# Patient Record
Sex: Female | Born: 1980 | Race: Black or African American | Hispanic: No | Marital: Single | Smoking: Former smoker
Health system: Southern US, Community
[De-identification: ages and names within clinical notes are randomized; demographics above are authoritative.]

## PROBLEM LIST (undated history)

## (undated) ENCOUNTER — Inpatient Hospital Stay (HOSPITAL_COMMUNITY): Payer: Self-pay

## (undated) DIAGNOSIS — E669 Obesity, unspecified: Secondary | ICD-10-CM

## (undated) DIAGNOSIS — F419 Anxiety disorder, unspecified: Secondary | ICD-10-CM

## (undated) HISTORY — DX: Obesity, unspecified: E66.9

## (undated) HISTORY — PX: NO PAST SURGERIES: SHX2092

## (undated) HISTORY — DX: Anxiety disorder, unspecified: F41.9

---
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
## 2002-03-24 ENCOUNTER — Encounter: Admission: RE | Admit: 2002-03-24 | Discharge: 2002-03-24 | Payer: Self-pay | Admitting: *Deleted

## 2002-05-19 ENCOUNTER — Emergency Department (HOSPITAL_COMMUNITY): Admission: EM | Admit: 2002-05-19 | Discharge: 2002-05-19 | Payer: Self-pay | Admitting: Emergency Medicine

## 2002-05-22 ENCOUNTER — Encounter: Admission: RE | Admit: 2002-05-22 | Discharge: 2002-05-22 | Payer: Self-pay | Admitting: Obstetrics and Gynecology

## 2002-06-12 ENCOUNTER — Encounter: Admission: RE | Admit: 2002-06-12 | Discharge: 2002-06-12 | Payer: Self-pay | Admitting: *Deleted

## 2004-11-11 ENCOUNTER — Emergency Department (HOSPITAL_COMMUNITY): Admission: EM | Admit: 2004-11-11 | Discharge: 2004-11-11 | Payer: Self-pay | Admitting: Emergency Medicine

## 2009-01-25 ENCOUNTER — Ambulatory Visit: Payer: Self-pay | Admitting: Family Medicine

## 2009-01-25 DIAGNOSIS — F329 Major depressive disorder, single episode, unspecified: Secondary | ICD-10-CM

## 2009-01-25 DIAGNOSIS — J45909 Unspecified asthma, uncomplicated: Secondary | ICD-10-CM | POA: Insufficient documentation

## 2009-01-25 DIAGNOSIS — E669 Obesity, unspecified: Secondary | ICD-10-CM

## 2009-02-22 ENCOUNTER — Ambulatory Visit: Payer: Self-pay | Admitting: Family Medicine

## 2009-07-15 ENCOUNTER — Ambulatory Visit: Payer: Self-pay | Admitting: Interventional Radiology

## 2009-07-15 ENCOUNTER — Ambulatory Visit (HOSPITAL_BASED_OUTPATIENT_CLINIC_OR_DEPARTMENT_OTHER): Admission: RE | Admit: 2009-07-15 | Discharge: 2009-07-15 | Payer: Self-pay | Admitting: Internal Medicine

## 2009-07-15 ENCOUNTER — Ambulatory Visit: Payer: Self-pay | Admitting: Family

## 2009-10-04 ENCOUNTER — Ambulatory Visit: Payer: Self-pay | Admitting: Family

## 2009-12-28 ENCOUNTER — Ambulatory Visit: Payer: Self-pay | Admitting: Family

## 2009-12-28 DIAGNOSIS — F341 Dysthymic disorder: Secondary | ICD-10-CM

## 2010-01-03 ENCOUNTER — Telehealth (INDEPENDENT_AMBULATORY_CARE_PROVIDER_SITE_OTHER): Payer: Self-pay | Admitting: *Deleted

## 2010-01-06 ENCOUNTER — Telehealth: Payer: Self-pay | Admitting: Family

## 2010-01-13 ENCOUNTER — Ambulatory Visit: Payer: Self-pay | Admitting: *Deleted

## 2010-01-18 ENCOUNTER — Ambulatory Visit: Payer: Self-pay | Admitting: *Deleted

## 2010-01-24 ENCOUNTER — Telehealth: Payer: Self-pay | Admitting: Family

## 2010-01-25 ENCOUNTER — Ambulatory Visit: Payer: Self-pay | Admitting: *Deleted

## 2010-01-27 ENCOUNTER — Telehealth: Payer: Self-pay | Admitting: Internal Medicine

## 2010-02-02 ENCOUNTER — Ambulatory Visit: Payer: Self-pay | Admitting: Family

## 2010-02-08 ENCOUNTER — Ambulatory Visit: Payer: Self-pay | Admitting: *Deleted

## 2010-04-14 ENCOUNTER — Ambulatory Visit: Payer: Self-pay | Admitting: Family

## 2010-04-14 DIAGNOSIS — R599 Enlarged lymph nodes, unspecified: Secondary | ICD-10-CM | POA: Insufficient documentation

## 2010-04-17 ENCOUNTER — Telehealth: Payer: Self-pay | Admitting: Family

## 2010-06-06 ENCOUNTER — Encounter: Payer: Self-pay | Admitting: Family

## 2010-06-06 ENCOUNTER — Ambulatory Visit: Payer: Self-pay | Admitting: Family

## 2010-06-06 ENCOUNTER — Ambulatory Visit (HOSPITAL_COMMUNITY): Admission: RE | Admit: 2010-06-06 | Discharge: 2010-06-06 | Payer: Self-pay | Admitting: Psychiatry

## 2010-06-06 ENCOUNTER — Emergency Department (HOSPITAL_BASED_OUTPATIENT_CLINIC_OR_DEPARTMENT_OTHER): Admission: EM | Admit: 2010-06-06 | Discharge: 2010-06-06 | Payer: Self-pay | Admitting: Emergency Medicine

## 2010-06-09 ENCOUNTER — Telehealth: Payer: Self-pay | Admitting: Family

## 2010-06-13 ENCOUNTER — Telehealth: Payer: Self-pay | Admitting: Family

## 2010-06-16 ENCOUNTER — Telehealth: Payer: Self-pay | Admitting: Family

## 2010-06-21 ENCOUNTER — Telehealth: Payer: Self-pay | Admitting: Family

## 2010-07-17 ENCOUNTER — Ambulatory Visit: Payer: Self-pay | Admitting: Family

## 2010-07-17 LAB — CONVERTED CEMR LAB: Beta hcg, urine, semiquantitative: NEGATIVE

## 2010-07-21 ENCOUNTER — Ambulatory Visit: Payer: Self-pay | Admitting: Family

## 2010-07-21 ENCOUNTER — Other Ambulatory Visit: Admission: RE | Admit: 2010-07-21 | Discharge: 2010-07-21 | Payer: Self-pay | Admitting: Internal Medicine

## 2010-07-21 DIAGNOSIS — J209 Acute bronchitis, unspecified: Secondary | ICD-10-CM

## 2010-07-21 LAB — CONVERTED CEMR LAB: Pap Smear: NEGATIVE

## 2010-07-21 LAB — HM PAP SMEAR

## 2010-07-26 ENCOUNTER — Telehealth: Payer: Self-pay | Admitting: Family

## 2010-08-16 ENCOUNTER — Encounter: Payer: Self-pay | Admitting: Family

## 2010-08-22 ENCOUNTER — Ambulatory Visit: Payer: Self-pay | Admitting: Family

## 2010-08-22 ENCOUNTER — Telehealth: Payer: Self-pay | Admitting: Family

## 2010-08-29 ENCOUNTER — Ambulatory Visit: Payer: Self-pay | Admitting: Family

## 2010-10-10 NOTE — Progress Notes (Signed)
  Phone Note Outgoing Call   Call placed by: Lemont Fillers FNP,  April 17, 2010 10:01 AM Call placed to: Patient Summary of Call: Called patient to follow up.  She reports that she is feeling better.  Pain/swelling is improved.  Recommended that she keep her scheduled follow up on 8/12- call sooner if problems.  Initial call taken by: Lemont Fillers FNP,  April 17, 2010 10:02 AM

## 2010-10-10 NOTE — Progress Notes (Signed)
  Phone Note Outgoing Call   Call placed by: Lemont Fillers FNP,  June 21, 2010 4:30 PM Call placed to: Patient Summary of Call: Called patient to check on her.  She tells me that she is doing well. She had a family emergency and missed her appointment.  She is back to work today.  Recommended to patient that she arrange a follow up apt in a few weeks.  She agrees to this plan. Initial call taken by: Lemont Fillers FNP,  June 21, 2010 4:31 PM

## 2010-10-10 NOTE — Letter (Signed)
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 [address]   [place], [place] [address]   Phone: [phone]  Fax: [phone]    April 14, 2010   Employee:  ARLISS HEPBURN    To Whom It May Concern:   For Medical reasons, please excuse the above named employee from work for the following dates:  Start:   04/14/10  End:   04/16/10  If you need additional information, please feel free to contact our office.         Sincerely,    Lemont Fillers FNP

## 2010-10-10 NOTE — Progress Notes (Signed)
----   Converted from flag ---- ---- 12/29/2009 3:48 PM, Lemont Fillers FNP wrote: Pls call patient and let her know that her FMLA forms are available for pick up.  (in folder) Could you pls copy these forms to be scanned also.  Thanks ------------------------------  Pt notified forms are ready to be picked up. Copy sent to be scanned.

## 2010-10-10 NOTE — Progress Notes (Signed)
  Phone Note Outgoing Call   Call placed by: Lemont Fillers FNP,  June 09, 2010 2:24 PM Call placed to: Patient Summary of Call: Called patient to see how she was feeling.  She tells me that she is feeling better today.  She did go over to [employer], but felt that the group setting "was not for me." She wishes to follow up with Alain Honey.  Denies current suicide ideation.  She will plan to f/u on 10/9.  Taking citalopram- tolerating it well. Initial call taken by: Lemont Fillers FNP,  June 09, 2010 2:25 PM

## 2010-10-10 NOTE — Progress Notes (Signed)
Summary: ?date of return to work  Phone Note Call from Patient   Caller: Patient Call For: Lemont Fillers FNP Summary of Call: Pt left voice message stating that she thought Melissa told her she could return to work on 10/11. Form says 27th. Pt would like clarification so she can return to work. Please advise. Nicki Guadalajara Fergerson CMA Duncan Dull)  June 16, 2010 11:48 AM   Follow-up for Phone Call        If she is feeling well, I think that she should be able to return on 10/11, however, I would like to see her back before that time for a follow up visit and I will complete her paperwork at that time. Follow-up by: Lemont Fillers FNP,  June 16, 2010 11:53 AM  Additional Follow-up for Phone Call Additional follow up Details #1::        Pt states she is feeling well enough to return to work on the 11th. She has scheduled appt for 10/10 @ 2:15pm. Mervin Kung CMA (AAMA)  June 16, 2010 1:45 PM

## 2010-10-10 NOTE — Assessment & Plan Note (Signed)
Summary: LUMP IN THROAT./MHF--Rm 4   Vital Signs:  Patient profile:   30 year old female Height:      63 inches Weight:      244 pounds BMI:     43.38 Temp:     98.2 degrees F oral Pulse rate:   72 / minute Pulse rhythm:   regular Resp:     18 per minute BP sitting:   128 / 88  (right arm) Cuff size:   large  Vitals Entered By: Mervin Kung CMA Duncan Dull) (July 17, 2010 8:50 AM) CC: Rm 4   Pt states states she has swelling under her chin since Saturday, also has chest soreness.  Still having knee pains and weakness and pains in her fingers. Is Patient Diabetic? No Comments Pt states Ortho Evra patch would not stay on. Does not have replacement at this time. Pt has completed Augmentin. Nicki Guadalajara Fergerson CMA Duncan Dull)  July 17, 2010 8:58 AM    Primary Care Provider:  Lemont Fillers FNP  CC:  Rm 4   Pt states states she has swelling under her chin since Saturday and also has chest soreness.  Still having knee pains and weakness and pains in her fingers..  History of Present Illness: Stacey Chen is a 30 year old female who presents today with complaint of "lump in throat" and for follow up.   1) Depression- Patient is taking Citalopram 20.  Denies SE's.  Overall energy is impr0oved.  "almost back to my old self."  Feels like she is handling stress better, though she is in the middle of a separation.  Some trouble sleepins some nights.  Able to get OOB in the AM.  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    2) Birth Control- stopped patch due to "falling off."    3) "lump in throat"  Symptoms started saturday, + cold symptoms x 4 days.  Took generic cough/cold.  Feels achey especially in her hands and right knee pain.  Knee bothers her the most after work.  Notes that her knee pain became worse after she Denies fever, mild cough resolved.  Mild nasal congesiton- yellow mucous.    Allergies (verified): No Known Drug Allergies  Past History:  Past Medical History: Last  updated: 01/25/2009 Mild intermittent asthma obesity anxiety  gyn Dr Marcelle Overlie  Past Surgical History: Last updated: 01/25/2009 none  Review of Systems       see HPI  Physical Exam  General:  Well-developed,well-nourished,in no acute distress; alert,appropriate and cooperative throughout examination Head:  Normocephalic and atraumatic without obvious abnormalities. No apparent alopecia or balding. Mouth:  Oral mucosa and oropharynx without lesions or exudates.  Teeth in good repair. Neck:  + marble sized tender mass beneath chin without associated erythema or swelling Lungs:  Normal respiratory effort, chest expands symmetrically. Lungs are clear to auscultation, no crackles or wheezes. Heart:  Normal rate and regular rhythm. S1 and S2 normal without gallop, murmur, click, rub or other extra sounds. Psych:  Cognition and judgment appear intact. Alert and cooperative with normal attention span and concentration. No apparent delusions, illusions, hallucinations   Impression & Recommendations:  Problem # 1:  LYMPHADENOPATHY (ICD-785.6) Assessment New Will plan empiric treatment with augmentin.  May be reactive LN due to viral etiology.  Plan close follow up.  Pt instructed to call if symptoms occur as listed on pt sign out sheet.  Her updated medication list for this problem includes:  Augmentin 875-125 Mg Tabs (Amoxicillin-pot clavulanate) ..... One tablet by mouth two times a day x 10 days  Problem # 2:  DEPRESSION (ICD-311) Assessment: Improved Depression is much improved today.  She is tolerating citalopram.  Plan to continue same.  Her updated medication list for this problem includes:    Citalopram Hydrobromide 20 Mg Tabs (Citalopram hydrobromide) ..... One tablet by mouth daily    Klonopin 0.5 Mg Tabs (Clonazepam) ..... One tab by mouth every 8 hours as needed for anxiety  Problem # 3:  CONTRACEPTIVE MANAGEMENT (ICD-V25.09) Assessment: Comment Only Urine pregnancy test  was negative.  Pt to start OCP today.  Advised to use condoms for prevention of STD.  Also advise pt that the augmentin may interfere with the efficacy of the pill- and to use condoms as back up method.  Pt verbalizes understanding Orders: Urine Pregnancy Test  ([address])  Complete Medication List: 1)  Ventolin Hfa 108 (90 Base) Mcg/act Aers (Albuterol sulfate) .... 2 puffs every 6 hours as needed for wheezing 2)  Advair Hfa 115-21 Mcg/act Aero (Fluticasone-salmeterol) .... 2 puffs twice daily 3)  Citalopram Hydrobromide 20 Mg Tabs (Citalopram hydrobromide) .... One tablet by mouth daily 4)  Microgestin Fe 1.5/30 1.5-30 Mg-mcg Tabs (Norethin ace-eth estrad-fe) .... Take as directed. 5)  Klonopin 0.5 Mg Tabs (Clonazepam) .... One tab by mouth every 8 hours as needed for anxiety 6)  Augmentin 875-125 Mg Tabs (Amoxicillin-pot clavulanate) .... One tablet by mouth two times a day x 10 days  Patient Instructions: 1)  Please schedule a follow up appointment on Friday for a Pap smear and follow up of your throat pain. 2)  Start your birth control pill tonight. 3)  Call if fever, chills, increased pain, swelling, redness, of your neck/throat. Prescriptions: CITALOPRAM HYDROBROMIDE 20 MG TABS (CITALOPRAM HYDROBROMIDE) one tablet by mouth daily  #30 x 2   Entered and Authorized by:   Lemont Fillers FNP   Signed by:   Lemont Fillers FNP on 07/17/2010   Method used:   Electronically to        [facility] [phone]* (retail)       [address] [place], [place]  [address]       Ph: [phone]       Fax: [phone]   RxID:   [id number] AUGMENTIN 875-125 MG TABS (AMOXICILLIN-POT CLAVULANATE) one tablet by mouth two times a day x 10 days  #20 x 0   Entered and Authorized by:   Lemont Fillers FNP   Signed by:   Lemont Fillers FNP on 07/17/2010   Method used:   Electronically to        [facility] [phone]* (retail)       [address] [place], [place]  [address]       Ph: [phone]       Fax: [phone]   RxID:   [id number] MICROGESTIN FE 1.5/30 1.5-30 MG-MCG TABS (NORETHIN ACE-ETH ESTRAD-FE) take as directed.  #1 x 11   Entered and Authorized by:   Lemont Fillers FNP   Signed by:   Lemont Fillers FNP on 07/17/2010   Method used:   Electronically to        [facility] [phone]* (retail)  [address] [place], [place]  [address]       Ph: [phone]       Fax: [phone]   RxID:   [id number]    Orders Added: 1)  Urine Pregnancy Test  [[address]] 2)  Est. Patient Level III [[address]]    Current Allergies (reviewed today): No known allergies    Laboratory Results   Urine Tests   Date/Time Reported: Mervin Kung CMA Duncan Dull)  July 17, 2010 9:38 AM     Urine HCG: negative

## 2010-10-10 NOTE — Progress Notes (Signed)
Summary: Irregular Bleeding  Phone Note Call from Patient Call back at Home Phone [phone]   Caller: Patient Summary of Call: patient called and left voice message stating is having problems with her menstrual cycle,and would like a call back.  call was retruned to patient at  [phone]. She states she is on her 4th week of her birth control , and  had been bleeding for 14 days prior to start of her 4th week. She is wearing tampons and using 2-3 daily, she is cramping, and states she  is not really bleeding that heavy. She wanted to know if she should be concerned. She does not have gynecologist that she is following. She states Efraim Kaufmann is following her for her gyn needs. She is aware that Efraim Kaufmann is out of the office today. I informed patient that I would check with Dr Artist Pais to see what he advises and get back to her.  Initial call taken by: Glendell Docker CMA,  Jan 27, 2010 12:15 PM  Follow-up for Phone Call        I suggest f/u with Melissa on Monday as  long as no severe bleeding Follow-up by: D. Thomos Lemons DO,  Jan 27, 2010 1:09 PM  Additional Follow-up for Phone Call Additional follow up Details #1::        attempted to contact patient at [phone], no answer, detailed voice message left informing patient per Dr Artist Pais instructions Additional Follow-up by: Glendell Docker CMA,  Jan 27, 2010 4:38 PM

## 2010-10-10 NOTE — Letter (Signed)
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 [address]   [place], [place] [address]   Phone: [phone]  Fax: [phone]    June 06, 2010   Employee:  Stacey Chen    To Whom It May Concern:   For Medical reasons, please excuse the above named employee from work for the following dates:  Start:   06/06/10  End:   06/20/10  If you need additional information, please feel free to contact our office.         Sincerely,    Lemont Fillers FNP
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
## 2010-10-10 NOTE — Assessment & Plan Note (Signed)
Summary: BREATHING ISSUES/MHF   Vital Signs:  Patient profile:   30 year old female Weight:      260.50 pounds O2 Sat:      99 % on Room air Temp:     98.2 degrees F oral Pulse rate:   95 / minute BP sitting:   130 / 80  Vitals Entered By: Stacey Chen (October 04, 2009 2:14 PM)  O2 Flow:  Room air CC: c/o sob,cough,sob,sorethroat runny nose   Primary Care Provider:  Seymour Bars DO  CC:  c/o sob, cough, sob, and sorethroat runny nose.  History of Present Illness: Stacey Chen presents today with complaint of chest tightness, worse with exertion. Symptoms started 1 week ago.   Notes + wheezing.  Was seen in early November with same symptoms and had a negative CXR and symptoms improved with prednisone.  Notes that her asthma typically flares with change of seasons.  Has had asthma since she was a baby.  Notes that she is using her MDI every 3 hours.    Allergies: No Known Drug Allergies  Review of Systems       Denies fever,  Cough productive of milky colored sputum with brown "specks" Notes + nasal discharge- yellow,  + HA  Physical Exam  General:  Well-developed,well-nourished,in no acute distress; alert,appropriate and cooperative throughout examination Ears:  External ear exam shows no significant lesions or deformities.  Otoscopic examination reveals clear canals, tympanic membranes are intact bilaterally without bulging, retraction, inflammation or discharge. Hearing is grossly normal bilaterally. Mouth:  Oral mucosa and oropharynx without lesions or exudates.  Teeth in good repair. Neck:  No deformities, masses, or tenderness noted. Lungs:  fair air movement, no audible wheezing, no crackles, + cough Heart:  Normal rate and regular rhythm. S1 and S2 normal without gallop, murmur, click, rub or other extra sounds.   Impression & Recommendations:  Problem # 1:  ASTHMA, WITH ACUTE EXACERBATION (ION-629.52) Assessment Deteriorated Wil add maintenance med Advair, will  also treat with cefdinir-  patient given sample of advair lot [id number] exp 05/2010- patient instructed on use and to gargle after each use to avoid thrush The following medications were removed from the medication list:    Prednisone (pak) 10 Mg Tabs (Prednisone) .Marland KitchenMarland KitchenMarland KitchenMarland Kitchen 3 tabs for 2 days, then 2 tabs for 2 days, then 1 tab for 2 days then stop Her updated medication list for this problem includes:    Ventolin Hfa 108 (90 Base) Mcg/act Aers (Albuterol sulfate) .Marland Kitchen... 2 puffs every 6 hours as needed for wheezing    Advair Diskus 100-50 Mcg/dose Aepb (Fluticasone-salmeterol) ..... One puff twice daily    Prednisone 10 Mg Tabs (Prednisone) .Marland Kitchen... Take as directed  Problem # 2:  SINUSITIS (ICD-473.9) Assessment: New Will treat with cefdinir.    The following medications were removed from the medication list:    Zithromax Z-pak 250 Mg Tabs (Azithromycin) .Marland Kitchen... 2 tabs by mouth today, then one tablet by mouth daily for 4 days. Her updated medication list for this problem includes:    Cefdinir 300 Mg Caps (Cefdinir) ..... One tablet by mouth two times a day x 10 days  Complete Medication List: 1)  Ventolin Hfa 108 (90 Base) Mcg/act Aers (Albuterol sulfate) .... 2 puffs every 6 hours as needed for wheezing 2)  Cefdinir 300 Mg Caps (Cefdinir) .... One tablet by mouth two times a day x 10 days 3)  Advair Diskus 100-50 Mcg/dose Aepb (Fluticasone-salmeterol) .... One puff twice daily 4)  Prednisone 10 Mg Tabs (Prednisone) .... Take as directed  Patient Instructions: 1)  Prednisone taper- take 4 tablets daily x 2 days, then 3 tabs daily x 2 days, then 2 tabs daily x 2 days, then 1 tab daily for 2 days then stop. 2)  Call if your symptoms worsen or do not improve.  If severe difficulty breathing go to ER. 3)  Follow up in 10 days. Prescriptions: PREDNISONE 10 MG TABS (PREDNISONE) take as directed  #22 x 0   Entered and Authorized by:   Lemont Fillers FNP   Signed by:   Lemont Fillers FNP on  10/04/2009   Method used:   Electronically to        [facility] [phone]* (retail)       [address] [place], [place]  [address]       Ph: [phone]       Fax: [phone]   RxID:   [id number] ADVAIR DISKUS 100-50 MCG/DOSE AEPB (FLUTICASONE-SALMETEROL) one puff twice daily  #1 x 0   Entered and Authorized by:   Lemont Fillers FNP   Signed by:   Lemont Fillers FNP on 10/04/2009   Method used:   Electronically to        [facility] [phone]* (retail)       [address] [place], [place]  [address]       Ph: [phone]       Fax: [phone]   RxID:   [id number] CEFDINIR 300 MG CAPS (CEFDINIR) one tablet by mouth two times a day x 10 days  #20 x 0   Entered and Authorized by:   Lemont Fillers FNP   Signed by:   Lemont Fillers FNP on 10/04/2009   Method used:   Electronically to        [facility] [phone]* (retail)       [address] [place], [place]  [address]       Ph: [phone]       Fax: [phone]   RxID:   [id number]

## 2010-10-10 NOTE — Assessment & Plan Note (Signed)
Summary: SORE THROAT/MHF--Rm 5   Vital Signs:  Patient profile:   30 year old female Height:      63 inches Weight:      248.50 pounds BMI:     44.18 Temp:     98.5 degrees F oral Pulse rate:   84 / minute Pulse rhythm:   regular Resp:     20 per minute BP sitting:   108 / 68  (right arm) Cuff size:   thigh  Vitals Entered By: Mervin Kung CMA Duncan Dull) (April 14, 2010 9:55 AM) CC: Room 5   Pt states that she has had swelling and pain on the right side of her face, head, neck and throat x 2 days. Is Patient Diabetic? No Comments Pt has stopped Loestrin because she can't remember to take it. Wants to try a different method.  Nicki Guadalajara Fergerson CMA [facility]ull)  April 14, 2010 9:50 AM    Primary Care Provider:  Seymour Bars DO  CC:  Room 5   Pt states that she has had swelling and pain on the right side of her face, head, and neck and throat x 2 days.Marland Kitchen  History of Present Illness: Stacey Chen is a 30 year old female who presents today with complaint of tenderness of the right side of her neck.  Notes that it hurts to swallow, eat, yawn. She also has pain on the right side of her face and head.  She Denies fever,  + nausea, + fatigue, + weakness.  Took a daytime OTC sinus prep without improvement.  These symptoms started 2 days.  Having difficulty sleeping.    Patient reports that she has been having trouble remembering to take her birth control pill.  She has discontinued, but would like to discuss an alternative method. Gained a lot of weight on Depo in the past. Did not like nuvaring- found it uncomfortable.  Allergies (verified): No Known Drug Allergies  Past History:  Past Medical History: Last updated: 01/25/2009 Mild intermittent asthma obesity anxiety  gyn Dr Marcelle Overlie  Past Surgical History: Last updated: 01/25/2009 none  Family History: Last updated: 01/25/2009 Mother: depression, drug abuse, ETOH Father Drug abuse Grandparents: drugs and ETOH 2 younger sisters  healthy  Social History: Last updated: 01/25/2009 Lives at home w/ husband.  No kids. Smokes THC.  Occas ETOH. Works  in [employer]. In wt loss program, exercising regularly.  Risk Factors: Alcohol Use: 1 every 5 months (07/15/2009) Caffeine Use: 1 -2 beverages per month (07/15/2009) Exercise: no (07/15/2009)  Risk Factors: Smoking Status: never (07/15/2009)  Physical Exam  General:  Well-developed, morbidly obese AA female in no acute distress; alert,appropriate and cooperative throughout examination Ears:  R ear normal and L ear normal.   Mouth:  Oral mucosa and oropharynx without lesions or exudates.  Teeth in good repair.no exudates.   Neck:  + tenderess under right side of jaw, no sigificant swelling.   Lungs:  Normal respiratory effort, chest expands symmetrically. Lungs are clear to auscultation, no crackles or wheezes. Heart:  Normal rate and regular rhythm. S1 and S2 normal without gallop, murmur, click, rub or other extra sounds.   Impression & Recommendations:  Problem # 1:  PAROTITIS, RIGHT (ICD-527.2) Assessment New Rapid strep is negative.  Will plan to treat patient with Augmentin.  If symptoms worsen or do not improve, consider CT neck for further evaluation. She was advised that this may interfere the effectiveness of her birth control and she should use back up  protection.  Problem # 2:  CONTRACEPTIVE MANAGEMENT (ICD-V25.09) Assessment: Comment Only Will give trial of OrthoEvra patch.  Complete Medication List: 1)  Ventolin Hfa 108 (90 Base) Mcg/act Aers (Albuterol sulfate) .... 2 puffs every 6 hours as needed for wheezing 2)  Advair Hfa 115-21 Mcg/act Aero (Fluticasone-salmeterol) .... 2 puffs twice daily 3)  Cymbalta 60 Mg Cpep (Duloxetine hcl) .... One tablet by mouth daily 4)  Ortho Evra 150-20 Mcg/24hr Ptwk (Norelgestromin-eth estradiol) .... Info: start on day 1 of menstrual cycle or on 1st "sunday after onset of menstrual cycle apply patch same day each  wk 5)  Klonopin 0.5 Mg Tabs (Clonazepam) .... One tab by mouth every 8 hours as needed for anxiety 6)  Augmentin 875-125 Mg Tabs (Amoxicillin-pot clavulanate) .... One tablet by mouth two times a day x 10 days  Other Orders: Rapid Strep (87880)  Patient Instructions: 1)  Call if you develop fever, increased pain, swelling, or inability to swallow.   2)  Please follow up in 1 week. 3)  Ortho- Evra Patch:  4)  The Patch may be placed on your upper outer arm, abdomen, buttock or back, in a place where it won't be rubbed by tight clothing;, for example, do not place the Patch at waistband of your clothing. 5)  - Do not put the Patch on your breasts, on cut or irritated skin, or on same location as the last Patch 6)  -Before you apply the Patch: 7)  - Make sure your skin is clean and dry 8)  - Do not use lotions, creams, oils, powders, and make-up at the Patch site. It may cause the Patch to not stick properly or become loose 9)  - If a patch results in uncomfortable irritation, the patch may be removed and a new patch may be applied to a new loction until the next change day. Only one patch should be worn at a time Prescriptions: AUGMENTIN 875-125 MG TABS (AMOXICILLIN-POT CLAVULANATE) one tablet by mouth two times a day x 10 days  #20 x 0   Entered and Authorized by:   Melissa S O'Sullivan FNP   Signed by:   Melissa S O'Sullivan FNP on 04/14/2010   Method used:   Electronically to        [facility]  [address] (retail)       [address]       [place]       [place], [place]  [address]       Ph: [phone]       Fax: [phone]   RxID:   [id number] ORTHO EVRA 150-20 MCG/24HR PTWK (NORELGESTROMIN-ETH ESTRADIOL) Info: start on day 1 of menstrual cycle or on 1st Sunday after onset of menstrual cycle apply patch same day each wk  #3 x 5   Entered and Authorized by:   Melissa S O'Sullivan FNP   Signed by:   Melissa S O'Sullivan FNP on 04/14/2010   Method used:    Electronically to        [facility]  [address] (retail)       [address] [place], [place]  [address]       Ph: [phone]       Fax: [phone]   RxID:   [id number]   Current Allergies (reviewed today): No known allergies   Laboratory Results  Date/Time Received: 04/14/10 Date/Time Reported: Mervin Kung CMA (  AAMA)  April 14, 2010 10:04 AM   Other Tests  Rapid Strep: negative  Kit Test Internal QC: Positive   (Normal Range: Negative)

## 2010-10-10 NOTE — Assessment & Plan Note (Signed)
Summary: depression/dt rsch per pt/dt--Rm 4   Vital Signs:  Patient profile:   30 year old female Height:      63 inches Weight:      244.50 pounds BMI:     43.47 Temp:     98.3 degrees F oral Pulse rate:   72 / minute Pulse rhythm:   regular Resp:     18 per minute BP sitting:   124 / 84  (right arm) Cuff size:   large  Vitals Entered By: Mervin Kung CMA [facility] D[facility]) (June 06, 2010 8:32 AM) CC: Rm 4    Pt states she has had 3 panic attacks at work in the last week.  Stress is interfering with her work. Wants to discuss medical leave., Depression Is Patient Diabetic? No Pain Assessment Patient in pain? no      Comments Pt is out of Cymbalta, Ortho Evra. Pt having difficulty affording medications due to financial issues at present.  Pt has completed Augmentin. Nicki Guadalajara Fergerson CMA [facility] Dull)  June 06, 2010 8:40 AM     Primary Care Provider:  Seymour Bars DO  CC:  Rm 4    Pt states she has had 3 panic attacks at work in the last week.  Stress is interfering with her work. Wants to discuss medical leave. and Depression.  History of Present Illness: Ms Ranes is a 30 year old female who presents for f/u of her anxiety/depression.  Ran out of cymbalta 3 weeks ago.  She has been having panic attacks on a daily basis.  She reports that she iIs under disciplinary action at workdue to absences. Feels tearful.  Reports daily thoughts of ending her life.  "It takes everything in me not to drive my car into a tree."    Depression History:      Positive alarm features for depression include insomnia.  However, she denies significant weight loss, significant weight gain, and fatigue (loss of energy).        Psychosocial stress factors include a recent marital separation.  Suicide risk questions reveal that she does not feel like life is worth living, she wishes that she were dead, and she has thought about ending her life.        Comments:  Grandmother has cancer, Dad overdosed- still  living. Sisters are homeless.  Having recurrent thoughts about "driving into a tree and ending it all."  .   Allergies (verified): No Known Drug Allergies  Past History:  Past Medical History: Last updated: 01/25/2009 Mild intermittent asthma obesity anxiety  gyn Dr Marcelle Overlie  Past Surgical History: Last updated: 01/25/2009 none  Family History: Reviewed history from 01/25/2009 and no changes required. Mother: depression, drug abuse, ETOH Father Drug abuse Grandparents: drugs and ETOH 2 younger sisters healthy  Social History: Reviewed history from 01/25/2009 and no changes required. Recently separated from husband.  No kids. Smokes THC.  Occas ETOH. Works at [employer] In wt loss program, exercising regularly.  Review of Systems       see HPI  Physical Exam  General:  Well-developed,well-nourished,in no acute distress; alert,appropriate and cooperative throughout examination Psych:  Oriented X3, memory intact for recent and remote, and normally interactive.  Tearful at times    Impression & Recommendations:  Problem # 1:  DEPRESSION/ANXIETY (ICD-300.4) Assessment Deteriorated  Patient has run out of her cymbalta x 3 weeks.  She is having active suicidal thoughts.  Rx provided for citalopram and klonopin.  Cymbalta co-pay is too high for her.  Patient will be given a trial of citalopram which will hopefully be more affordable for her.  Called charge nurse and gave report.  Pt was walked down to ED by CMA.  30 minutes spent with patient.  Greater than 50%of this time was spent counseling patient on her depression/anxiety.  Will fill FMLA paperwork for patient.   Orders: Form Completion (75643)  Complete Medication List: 1)  Ventolin Hfa 108 (90 Base) Mcg/act Aers (Albuterol sulfate) .... 2 puffs every 6 hours as needed for wheezing 2)  Advair Hfa 115-21 Mcg/act Aero (Fluticasone-salmeterol) .... 2 puffs twice daily 3)  Citalopram Hydrobromide 20 Mg Tabs (Citalopram  hydrobromide) .... 1/2 tablet by mouth once daily for one week, then increase to 1 tablet once daily 4)  Ortho Evra 150-20 Mcg/24hr Ptwk (Norelgestromin-eth estradiol) .... Info: start on day 1 of menstrual cycle or on 1st sunday after onset of menstrual cycle apply patch same day each wk 5)  Klonopin 0.5 Mg Tabs (Clonazepam) .... One tab by mouth every 8 hours as needed for anxiety 6)  Augmentin 875-125 Mg Tabs (Amoxicillin-pot clavulanate) .... One tablet by mouth two times a day x 10 days   Patient Instructions: 1)  Please go directly downstairs for further evaluation in the ED.   Prescriptions: KLONOPIN 0.5 MG TABS (CLONAZEPAM) one tab by mouth every 8 hours as needed for anxiety  #90 x 0   Entered and Authorized by:   Lemont Fillers FNP   Signed by:   Lemont Fillers FNP on 06/06/2010   Method used:   Print then Give to Patient   RxID:   [id number] CITALOPRAM HYDROBROMIDE 20 MG TABS (CITALOPRAM HYDROBROMIDE) 1/2 tablet by mouth once daily for one week, then increase to 1 tablet once daily  #30 x 1   Entered and Authorized by:   Lemont Fillers FNP   Signed by:   Lemont Fillers FNP on 06/06/2010   Method used:   Print then Give to Patient   RxID:   [id number]   Current Allergies (reviewed today): No known allergies

## 2010-10-10 NOTE — Assessment & Plan Note (Signed)
Summary: PAP/MHF   Vital Signs:  Patient profile:   30 year old female Height:      63 inches Weight:      245 pounds BMI:     43.56 O2 Sat:      99 % on Room air Temp:     98.0 degrees F oral Pulse rate:   65 / minute Resp:     18 per minute BP sitting:   102 / 70  (right arm) Cuff size:   large  Vitals Entered By: Glendell Docker CMA (July 21, 2010 10:11 AM)  O2 Flow:  Room air CC: pap & follow up Is Patient Diabetic? No Pain Assessment Patient in pain? no      Comments pap, lump gown down, productive cough  brown/yellow in color with breathing difficulty 5 days remaining on antibiotics   Primary Care Provider:  Lemont Fillers FNP  CC:  pap & follow up.  History of Present Illness: Stacey Chen is a 30 year old female who presents today for follow up.  Notes that the swelling beneath her chin is improved.  + Wheezing, brown/yellow sputum.  She continues the Augmentin. Denies fever, ear pain, + sore throat.   She is also  here to have her Pap smear performed.    Preventive Screening-Counseling & Management  Alcohol-Tobacco     Smoking Status: never  Allergies (verified): No Known Drug Allergies  Past History:  Past Medical History: Last updated: 01/25/2009 Mild intermittent asthma obesity anxiety  gyn Dr Marcelle Overlie  Past Surgical History: Last updated: 01/25/2009 none  Review of Systems       see HPI  Physical Exam  General:  Well-developed,well-nourished,in no acute distress; alert,appropriate and cooperative throughout examination Neck:  + firm nodule beneath chin, tender, smaller than last visit. Lungs:  Normal respiratory effort, chest expands symmetrically. Soft expiratory wheeze.  Good air movement to the bases. Heart:  Normal rate and regular rhythm. S1 and S2 normal without gallop, murmur, click, rub or other extra sounds. Abdomen:  Bowel sounds positive,abdomen soft and non-tender without masses, organomegaly or hernias  noted. Genitalia:  Pelvic Exam:        External: normal female genitalia without lesions or masses        Vagina: normal without lesions or masses, thick vaginal discharge        Cervix: normal without lesions or masses        Adnexa: normal bimanual exam without masses or fullness        Uterus: normal by palpation        Pap smear: performed   Impression & Recommendations:  Problem # 1:  ACUTE BRONCHITIS (ICD-466.0) Assessment New Will step up her Advair temporarily to 500/50 (sample given) with plan to drop back down to her current dose 115 of advair.  Add Cheratussin, continue augmentin. Her updated medication list for this problem includes:    Ventolin Hfa 108 (90 Base) Mcg/act Aers (Albuterol sulfate) .Marland Kitchen... 2 puffs every 6 hours as needed for wheezing    Advair Hfa 115-21 Mcg/act Aero (Fluticasone-salmeterol) .Marland Kitchen... 2 puffs twice daily    Augmentin 875-125 Mg Tabs (Amoxicillin-pot clavulanate) ..... One tablet by mouth two times a day x 10 days    Cheratussin Ac 100-10 Mg/16ml Syrp (Guaifenesin-codeine) .Marland Kitchen... 1-2 teaspoons every 6 hours as needed for cough    Advair Diskus 500-50 Mcg/dose Aepb (Fluticasone-salmeterol) ..... One puff twice daily for 1 week, then return to lower dose advair as before  Problem # 2:  ROUTINE GYNECOLOGICAL EXAMINATION (ICD-V72.31) Assessment: Comment Only Pt is requesting, gc/chlamydia and HIV screening as well.    Complete Medication List: 1)  Ventolin Hfa 108 (90 Base) Mcg/act Aers (Albuterol sulfate) .... 2 puffs every 6 hours as needed for wheezing 2)  Advair Hfa 115-21 Mcg/act Aero (Fluticasone-salmeterol) .... 2 puffs twice daily 3)  Citalopram Hydrobromide 20 Mg Tabs (Citalopram hydrobromide) .... One tablet by mouth daily 4)  Microgestin Fe 1.5/30 1.5-30 Mg-mcg Tabs (Norethin ace-eth estrad-fe) .... Take as directed. 5)  Klonopin 0.5 Mg Tabs (Clonazepam) .... One tab by mouth every 8 hours as needed for anxiety 6)  Augmentin 875-125 Mg Tabs  (Amoxicillin-pot clavulanate) .... One tablet by mouth two times a day x 10 days 7)  Cheratussin Ac 100-10 Mg/70ml Syrp (Guaifenesin-codeine) .Marland Kitchen.. 1-2 teaspoons every 6 hours as needed for cough 8)  Advair Diskus 500-50 Mcg/dose Aepb (Fluticasone-salmeterol) .... One puff twice daily for 1 week, then return to lower dose advair as before 9)  Fluconazole 150 Mg Tabs (Fluconazole) .... One tablet by mouth today, repeat in 3 days  Other Orders: T-HIV-1 (Screen) (16109) Pap Smear, Thin Prep ( Collection of) (U0454)  Patient Instructions: 1)  Call if your cough worsens or does not improve. 2)  Complete antibiotics. 3)  Go to lab on first floor this AM please. 4)  Follow up in 1 month.   Prescriptions: FLUCONAZOLE 150 MG TABS (FLUCONAZOLE) one tablet by mouth today, repeat in 3 days  #2 x 0   Entered and Authorized by:   Lemont Fillers FNP   Signed by:   Lemont Fillers FNP on 07/21/2010   Method used:   Samples Given   RxID:   [id number] ADVAIR DISKUS 500-50 MCG/DOSE AEPB (FLUTICASONE-SALMETEROL) one puff twice daily for 1 week, then return to lower dose advair as before  #1 x 0   Entered and Authorized by:   Lemont Fillers FNP   Signed by:   Lemont Fillers FNP on 07/21/2010   Method used:   Samples Given   RxID:   [id number] CHERATUSSIN AC 100-10 MG/5ML SYRP (GUAIFENESIN-CODEINE) 1-2 teaspoons every 6 hours as needed for cough  #120 x 0   Entered and Authorized by:   Lemont Fillers FNP   Signed by:   Lemont Fillers FNP on 07/21/2010   Method used:   Print then Give to Patient   RxID:   [id number]    Orders Added: 1)  T-HIV-1 (Screen) [44034] 2)  Pap Smear, Thin Prep ( Collection of) [Q0091] 3)  Est. Patient Level III [74259]   Immunization History:  Influenza Immunization History:    Influenza:  delcined (07/21/2010)  Tetanus/Td Immunization History:    Tetanus/Td:  historical (08/26/2007)   Contraindications/Deferment of  Procedures/Staging:    Test/Procedure: FLU VAX    Reason for deferment: patient declined   Immunization History:  Influenza Immunization History:    Influenza:  Delcined (07/21/2010)  Tetanus/Td Immunization History:    Tetanus/Td:  Historical (08/26/2007)    Current Allergies (reviewed today): No known allergies   Prevention & Chronic Care Immunizations   Influenza vaccine: Delcined  (07/21/2010)   Influenza vaccine deferral: patient declined  (07/21/2010)    Tetanus booster: 08/26/2007: Historical    Pneumococcal vaccine: Not documented  Other Screening   Pap smear: Not documented   Smoking status: never  (07/21/2010)

## 2010-10-10 NOTE — Progress Notes (Signed)
Summary: status update  Phone Note Outgoing Call   Summary of Call: Could you please call patient to see how her depression is doing on her new medicine?  Thanks Initial call taken by: Lemont Fillers FNP,  January 06, 2010 1:18 PM  Follow-up for Phone Call        Left message on machine to return call.  Mervin Kung CMA  January 06, 2010 2:33 PM   Left message on machine to return my call.  Mervin Kung CMA  Jan 09, 2010 11:05 AM   Additional Follow-up for Phone Call Additional follow up Details #1::        Pt returned my call. States she feels more calm but is still having some difficutly with her anxiety.  She states she has just started the twice a day dose and is aware it may take some time to feel the full affects of the medication.  Mervin Kung CMA  Jan 09, 2010 11:35 AM   Additional Follow-up by: Lemont Fillers FNP,  Jan 09, 2010 11:48 AM

## 2010-10-10 NOTE — Progress Notes (Signed)
  Phone Note Outgoing Call   Call placed by: Lemont Fillers FNP,  Jan 24, 2010 11:06 AM Call placed to: Patient Summary of Call: Reviewed Samara Deist Troxler's consultation re: patient's diagnosis of Bipolar disorder, and referral to Dr. Tacey Heap from psychiatry due to high degree of anxiety and depression, suicidal ideation, confusion about anger mood swings, poor decision-making and rcent social withdrawal.  Left message for patient to return my call.  Would like to see if she has seen Dr. Izola Price and if she is currently having active suicide ideation when she calls back.  She should keep her apt next week. Initial call taken by: Lemont Fillers FNP,  Jan 24, 2010 11:09 AM  Follow-up for Phone Call        Called patient to follow up.  She states overall she is feeling a little better than she was.  She tells me that she did not see Dr. Izola Price since he does not accept her insurance.  Denies current active suicide ideation.  Pt agrees to go to the ER if she develops recurrent suicide ideation.  Will try referral to another psychiatrist.  Pt instructed to keep upcoming appointment. Follow-up by: Lemont Fillers FNP,  Jan 25, 2010 2:58 PM

## 2010-10-10 NOTE — Progress Notes (Signed)
  Phone Note Outgoing Call   Call placed by: Lemont Fillers FNP,  July 26, 2010 4:40 PM Call placed to: Patient Summary of Call: Called patient, reviewed pap results.  Reviewed results + for trichomonas.  Recommended that her partner also be treated.  Rx sent to pharmacy for metronidazole.  Pt tells me rx for fluconazole did not make it to the pharmacy- will not resend at this time due to new finding of trich.  Will treat this.  Pt to call if symptoms worsen or do not improve.  Initial call taken by: Lemont Fillers FNP,  July 26, 2010 4:42 PM    New/Updated Medications: METRONIDAZOLE 500 MG TABS (METRONIDAZOLE) 4 tablet by mouth at once. Prescriptions: VENTOLIN HFA 108 (90 BASE) MCG/ACT AERS (ALBUTEROL SULFATE) 2 puffs every 6 hours as needed for wheezing  #1 x 1   Entered and Authorized by:   Lemont Fillers FNP   Signed by:   Lemont Fillers FNP on 07/26/2010   Method used:   Electronically to        [facility] [phone]* (retail)       [address] [place], [place]  [address]       Ph: [phone]       Fax: [phone]   RxID:   [id number] METRONIDAZOLE 500 MG TABS (METRONIDAZOLE) 4 tablet by mouth at once.  #4 x 0   Entered and Authorized by:   Lemont Fillers FNP   Signed by:   Lemont Fillers FNP on 07/26/2010   Method used:   Electronically to        [facility] [phone]* (retail)       [address] [place], [place]  [address]       Ph: [phone]       Fax: [phone]   RxID:   [id number]

## 2010-10-10 NOTE — Progress Notes (Signed)
Summary: FMLA / [employer] forms  Phone Note Outgoing Call   Summary of Call: FMLA and [employer] income protection forms completed. FMLA forms faxed to Barbaraann Faster [phone] on 06-09-10. [employer] form faxed to [phone] on 06-08-10 @ 1:30pm.  Mervin Kung CMA [facility] Dull)  June 13, 2010 9:50 AM

## 2010-10-10 NOTE — Letter (Signed)
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 [address]   [place], [place] [address]   Phone: [phone]  Fax: [phone]    October 04, 2009   Employee:  Stacey Chen    To Whom It May Concern:   For Medical reasons, please excuse the above named employee from work for the following dates:  Start:   1/24  End:   1/27  If you need additional information, please feel free to contact our office.         Sincerely,    Lemont Fillers FNP

## 2010-10-10 NOTE — Assessment & Plan Note (Signed)
Summary: weak, dizzy, nausea/dt   Vital Signs:  Patient profile:   30 year old female Height:      63 inches Weight:      256 pounds BMI:     45.51 O2 Sat:      99 % on Room air Temp:     98.7 degrees F oral Pulse rate:   102 / minute Pulse rhythm:   regular Resp:     16 per minute BP sitting:   110 / 68  (right arm) Cuff size:   thigh  Vitals Entered By: Mervin Kung CMA (December 28, 2009 1:35 PM)  O2 Flow:  Room air CC: room 5  Pt states she has had dizziness episodes intermittently x 2 weeks but seems to be increasing.  Would like to discuss restarting medication for mood stabilization.  Pt. would also like to restart birth control. Is Patient Diabetic? No   Primary Care Provider:  Seymour Bars DO  CC:  room 5  Pt states she has had dizziness episodes intermittently x 2 weeks but seems to be increasing.  Would like to discuss restarting medication for mood stabilization.  Pt. would also like to restart birth control.Marland Kitchen  History of Present Illness: Stacey Chen is a 30 year old female who presents today for follow up.  She notes that she has had some shortness of breath and dizzy spells which are accompanied by SOB and anxiety.  This interferes with her ability to work at [employer]. Last visit she was started on Advair for acute asthma exacerbation.  She felt that the discus "powder"  caused nausea, didn't like consisitency of powder.    Depression- she has been treated in the past.  She stopped medication due to feeling lack of emotion.  Pt was unsure of the med, but [facility] pharmacy reported that this medication was alprazolam.  Notes that she has felt overwhelmed with work, had increased irritabilty, panic attacks, dizzy spells.  Has "temper tantrums."  Cries daily.  She has seen therapist in the past but felt that she and the therapist were not a good fit.  Denies delusions.  Denies suicide ideation.  She also presents with FMLA papers.    Allergies (verified): No Known Drug  Allergies  Physical Exam  General:  Well-developed,well-nourished,in no acute distress; alert,appropriate and cooperative throughout examination Head:  Normocephalic and atraumatic without obvious abnormalities. No apparent alopecia or balding. Lungs:  Normal respiratory effort, chest expands symmetrically. Lungs are clear to auscultation, no crackles or wheezes. Heart:  Normal rate and regular rhythm. S1 and S2 normal without gallop, murmur, click, rub or other extra sounds.   Impression & Recommendations:  Problem # 1:  ASTHMA, WITH ACUTE EXACERBATION (ICD-493.92) Assessment Improved Pt is agreeable to try Advair HFA in place of Discus.  Sample given.   The following medications were removed from the medication list:    Advair Diskus 100-50 Mcg/dose Aepb (Fluticasone-salmeterol) ..... One puff twice daily    Prednisone 10 Mg Tabs (Prednisone) .Marland Kitchen... Take as directed Her updated medication list for this problem includes:    Ventolin Hfa 108 (90 Base) Mcg/act Aers (Albuterol sulfate) .Marland Kitchen... 2 puffs every 6 hours as needed for wheezing    Advair Hfa 115-21 Mcg/act Aero (Fluticasone-salmeterol) .Marland Kitchen... 2 puffs twice daily  Problem # 2:  DEPRESSION/ANXIETY (ICD-300.4) Assessment: Deteriorated Will give patient trial of Cymbalta and as needed klonopin.  Pt wishes referral to different therapist.  Pt was advise re: red flags and side effect per  pt instructions. 30 minutes spent with patient.  Greater than 50% of this time was spent counselling patient on her depression and anxiety. Orders: Psychology Referral (Psychology)  Problem # 3:  CONTRACEPTIVE MANAGEMENT (ICD-V25.09)  Complete Medication List: 1)  Ventolin Hfa 108 (90 Base) Mcg/act Aers (Albuterol sulfate) .... 2 puffs every 6 hours as needed for wheezing 2)  Advair Hfa 115-21 Mcg/act Aero (Fluticasone-salmeterol) .... 2 puffs twice daily 3)  Cymbalta 30 Mg Cpep (Duloxetine hcl) .... One tablet by mouth daily x 1 week, then increase to  two tabs by mouth daily as tolerated 4)  Loestrin Fe 1.5/30 1.5-30 Mg-mcg Tabs (Norethin ace-eth estrad-fe) .... Take as directed 5)  Klonopin 0.5 Mg Tabs (Clonazepam) .... One tab by mouth every 8 hours as needed for anxiety  Patient Instructions: 1)  Please follow up in 1 month for a PAP/physical and follow up on Depression (30 minute apt) 2)  Cymbalta- It will likely take several weeks before you will notice improvement. 3)  Side effects of this medicine may include drowsiness or nausea.  If this becomes an issue for you call for further instructions. 4)  Very rarely people may develop suicidal thoughts when taking these types of medicines- should this happen to you, discontinue medication and go directly to the emergency room. 5)  Please arrange a follow up appointment in 1 month  Prescriptions: KLONOPIN 0.5 MG TABS (CLONAZEPAM) one tab by mouth every 8 hours as needed for anxiety  #30 x 0   Entered and Authorized by:   Lemont Fillers FNP   Signed by:   Lemont Fillers FNP on 12/28/2009   Method used:   Print then Give to Patient   RxID:   [id number] LOESTRIN FE 1.5/30 1.5-30 MG-MCG TABS (NORETHIN ACE-ETH ESTRAD-FE) take as directed  #1 x 5   Entered and Authorized by:   Lemont Fillers FNP   Signed by:   Lemont Fillers FNP on 12/28/2009   Method used:   Electronically to        [id number]* (retail)       [address] [place], [place]  [address]       Ph: [phone]       Fax: [phone]   RxID:   [id number] CYMBALTA 30 MG CPEP (DULOXETINE HCL) one tablet by mouth daily x 1 week, then increase to two tabs by mouth daily as tolerated  #60 x 1   Entered and Authorized by:   Lemont Fillers FNP   Signed by:   Lemont Fillers FNP on 12/28/2009   Method used:   Electronically to        [facility] [phone]* (retail)       [address] [place], [place]   [address]       Ph: [phone]       Fax: [phone]   RxID:   [id number] ADVAIR HFA 115-21 MCG/ACT AERO (FLUTICASONE-SALMETEROL) 2 puffs twice daily  #1 x 2   Entered and Authorized by:   Lemont Fillers FNP   Signed by:   Lemont Fillers FNP on 12/28/2009   Method used:   Electronically to        [id number]* (retail)       [address]       [place], [place]  [address]       Ph: [phone]       Fax: [phone]   RxID:   [id number]   Current Allergies (reviewed today): No known allergies   Appended Document: weak, dizzy, nausea/dt     Allergies: No Known Drug Allergies   Impression & Recommendations:  Problem # 1:  ASTHMA, WITH ACUTE EXACERBATION (ICD-493.92)  Her updated medication list for this problem includes:    Ventolin Hfa 108 (90 Base) Mcg/act Aers (Albuterol sulfate) .Marland Kitchen... 2 puffs every 6 hours as needed for wheezing    Advair Hfa 115-21 Mcg/act Aero (Fluticasone-salmeterol) .Marland Kitchen... 2 puffs twice daily  Orders: Form Completion (84132)  Complete Medication List: 1)  Ventolin Hfa 108 (90 Base) Mcg/act Aers (Albuterol sulfate) .... 2 puffs every 6 hours as needed for wheezing 2)  Advair Hfa 115-21 Mcg/act Aero (Fluticasone-salmeterol) .... 2 puffs twice daily 3)  Cymbalta 30 Mg Cpep (Duloxetine hcl) .... One tablet by mouth daily x 1 week, then increase to two tabs by mouth daily as tolerated 4)  Loestrin Fe 1.5/30 1.5-30 Mg-mcg Tabs (Norethin ace-eth estrad-fe) .... Take as directed 5)  Klonopin 0.5 Mg Tabs (Clonazepam) .... One tab by mouth every 8 hours as needed for anxiety

## 2010-10-12 NOTE — Medication Information (Signed)
Summary: Care Consideration Regarding Adding Mood Stabilizer/[employer]  Care Consideration Regarding Adding Mood Stabilizer/[employer]   Imported By: Lanelle Bal 09/08/2010 07:54:04  _____________________________________________________________________  External Attachment:    Type:   Image     Comment:   External Document

## 2010-10-12 NOTE — Progress Notes (Signed)
Summary: waive no show  Phone Note Outgoing Call   Summary of Call: Please call patient and arrange a follow up visit. She "no showed" today. Thanks. Initial call taken by: Lemont Fillers FNP,  August 22, 2010 4:47 PM  Follow-up for Phone Call        Spoke to pt., she states she forgot about her appt on 08/22/10 and states she did not receive a reminder call.  Rescheduled appt for 08/29/10 @ 9:45.  Per Efraim Kaufmann, ok to waive no show fee. Nicki Guadalajara Fergerson CMA Duncan Dull)  August 23, 2010 3:29 PM   Additional Follow-up for Phone Call Additional follow up Details #1::        removed the no show  Roselle Locus  August 24, 2010 7:59 AM

## 2010-11-03 ENCOUNTER — Ambulatory Visit: Payer: Self-pay | Admitting: Family

## 2010-11-06 ENCOUNTER — Ambulatory Visit: Payer: Self-pay | Admitting: Family

## 2010-11-07 ENCOUNTER — Ambulatory Visit: Payer: Self-pay | Admitting: Family

## 2010-11-07 ENCOUNTER — Ambulatory Visit (INDEPENDENT_AMBULATORY_CARE_PROVIDER_SITE_OTHER): Payer: Private Health Insurance - Indemnity | Admitting: Family

## 2010-11-07 ENCOUNTER — Encounter: Payer: Self-pay | Admitting: Family

## 2010-11-07 DIAGNOSIS — F341 Dysthymic disorder: Secondary | ICD-10-CM

## 2010-11-07 DIAGNOSIS — J329 Chronic sinusitis, unspecified: Secondary | ICD-10-CM | POA: Insufficient documentation
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
## 2010-11-21 ENCOUNTER — Ambulatory Visit: Payer: Private Health Insurance - Indemnity | Admitting: Family

## 2010-11-22 ENCOUNTER — Ambulatory Visit: Payer: Private Health Insurance - Indemnity | Admitting: Family

## 2010-11-22 ENCOUNTER — Ambulatory Visit (INDEPENDENT_AMBULATORY_CARE_PROVIDER_SITE_OTHER): Payer: Private Health Insurance - Indemnity | Admitting: Family

## 2010-11-22 ENCOUNTER — Encounter: Payer: Self-pay | Admitting: Family

## 2010-11-22 DIAGNOSIS — F341 Dysthymic disorder: Secondary | ICD-10-CM

## 2010-11-23 LAB — BASIC METABOLIC PANEL
CO2: 24 mEq/L (ref 19–32)
Calcium: 9.6 mg/dL (ref 8.4–10.5)
Chloride: 110 mEq/L (ref 96–112)
GFR calc Af Amer: >60 mL/min (ref >60)
Sodium: 143 mEq/L (ref 135–145)

## 2010-11-23 LAB — DIFFERENTIAL
Eosinophils Absolute: 0.3 10*3/uL (ref 0.0–0.7)
Lymphs Abs: 2.2 10*3/uL (ref 0.7–4.0)
Monocytes Relative: 6 % (ref 3–12)
Neutrophils Relative %: 69 % (ref 43–77)

## 2010-11-23 LAB — CBC
Hemoglobin: 13.3 g/dL (ref 12.0–15.0)
MCH: 28.1 pg (ref 26.0–34.0)
MCV: 83.5 fL (ref 78.0–100.0)
RBC: 4.71 MIL/uL (ref 3.87–5.11)

## 2010-11-23 LAB — URINALYSIS, ROUTINE W REFLEX MICROSCOPIC
Bilirubin Urine: NEGATIVE
Ketones, ur: NEGATIVE mg/dL
Protein, ur: NEGATIVE mg/dL
Urobilinogen, UA: 0.2 mg/dL (ref 0.0–1.0)

## 2010-11-23 LAB — POCT TOXICOLOGY PANEL
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
## 2010-12-04 ENCOUNTER — Ambulatory Visit: Payer: Private Health Insurance - Indemnity | Admitting: Family

## 2010-12-08 ENCOUNTER — Encounter: Payer: Self-pay | Admitting: Family

## 2010-12-11 ENCOUNTER — Ambulatory Visit: Payer: Private Health Insurance - Indemnity | Admitting: Family

## 2010-12-22 ENCOUNTER — Ambulatory Visit: Payer: Private Health Insurance - Indemnity | Admitting: Family

## 2011-01-01 ENCOUNTER — Ambulatory Visit: Payer: Private Health Insurance - Indemnity | Admitting: Family

## 2011-02-21 ENCOUNTER — Encounter: Payer: Self-pay | Admitting: Family Medicine

## 2011-02-21 ENCOUNTER — Ambulatory Visit (INDEPENDENT_AMBULATORY_CARE_PROVIDER_SITE_OTHER): Payer: Private Health Insurance - Indemnity | Admitting: Family Medicine

## 2011-02-21 ENCOUNTER — Ambulatory Visit (HOSPITAL_BASED_OUTPATIENT_CLINIC_OR_DEPARTMENT_OTHER)
Admission: RE | Admit: 2011-02-21 | Discharge: 2011-02-21 | Disposition: A | Discharge status: Home / Self Care | Payer: Private Health Insurance - Indemnity | Source: Ambulatory Visit | Attending: Family Medicine | Admitting: Family Medicine

## 2011-02-21 VITALS — BP 110/74 | HR 65 | Temp 97.8°F | Ht 64.0 in | Wt 236.8 lb

## 2011-02-21 DIAGNOSIS — W19XXXA Unspecified fall, initial encounter: Secondary | ICD-10-CM

## 2011-02-21 DIAGNOSIS — M25561 Pain in right knee: Secondary | ICD-10-CM

## 2011-02-21 DIAGNOSIS — S99919A Unspecified injury of unspecified ankle, initial encounter: Secondary | ICD-10-CM

## 2011-02-21 DIAGNOSIS — S8991XA Unspecified injury of right lower leg, initial encounter: Secondary | ICD-10-CM

## 2011-02-21 DIAGNOSIS — M25569 Pain in unspecified knee: Secondary | ICD-10-CM | POA: Insufficient documentation

## 2011-02-21 DIAGNOSIS — R609 Edema, unspecified: Secondary | ICD-10-CM | POA: Insufficient documentation

## 2011-02-21 DIAGNOSIS — S8990XA Unspecified injury of unspecified lower leg, initial encounter: Secondary | ICD-10-CM

## 2011-02-21 MED ORDER — HYDROCODONE-ACETAMINOPHEN 5-325 MG PO TABS: 1.0000 | ORAL_TABLET | Freq: Four times a day (QID) | ORAL | Status: AC | PRN

## 2011-02-21 MED ORDER — IBUPROFEN 600 MG PO TABS: 600.0000 mg | ORAL_TABLET | Freq: Four times a day (QID) | ORAL | Status: AC | PRN

## 2011-02-21 NOTE — Patient Instructions (Signed)
Knee Pain The knee is a joint between your thigh and lower leg. It is made up of bones, tendons, ligaments, and cartilage. Knee pain can be caused by many things. A few causes could be injury to the knee, gout, arthritis, infections, or overuse during physical activity. HOME CARE  Only take medicine as told by your doctor.   Keep a healthy weight. Being overweight can make the knee hurt more.   Stretch before exercising or playing sports.   If there is constant knee pain, change the way you or your child exercises. Ask your doctor for advice.   Make sure shoes fit well. Choose the right shoe for the sport or activity.   Protect the knees. Wear kneepads if needed.   Rest when tired.  GET HELP IF:  There is knee pain that does not stop.   There is knee pain that does not seem to be getting better.  GET HELP RIGHT AWAY IF:  The knee joint feels hot to the touch.   .   Your baby is older than 3 months with a rectal temperature of 102 F (38.9 C) or higher.   Your baby is 52 months old or younger with a rectal temperature of 100.4 F (38 C) or higher.  MAKE SURE YOU:  Understand these instructions.   Will watch this condition.   Will get help right away if you or your child is not doing well or gets worse.  Document Released: 11/23/2008 Document Re-Released: 11/21/2009 [facility] Patient Information 2011 [place], [place].  Please ice for 15-20 minutes every 4 hours and keep leg elevated as much as possible

## 2011-02-22 ENCOUNTER — Telehealth: Payer: Self-pay | Admitting: Family

## 2011-02-22 NOTE — Telephone Encounter (Signed)
She has appt 02/23/11, I will contact [facility] to see if they can see her today

## 2011-02-22 NOTE — Telephone Encounter (Signed)
[place] at [facility], she will contact patient if she has any cancellations today

## 2011-02-22 NOTE — Telephone Encounter (Signed)
Message copied by Carmelia Bake on Thu Feb 22, 2011  8:47 AM ------      Message from: Danise Edge A      Created: Wed Feb 21, 2011  9:34 AM       Good Morning            The referral I just put through should be somewhat urgent, she acutely injured her knee and is unable to work at her job until we help her improve

## 2011-02-23 ENCOUNTER — Encounter: Payer: Self-pay | Admitting: Family Medicine

## 2011-02-23 ENCOUNTER — Ambulatory Visit (INDEPENDENT_AMBULATORY_CARE_PROVIDER_SITE_OTHER): Payer: Private Health Insurance - Indemnity | Admitting: Family Medicine

## 2011-02-23 VITALS — BP 112/69 | HR 70 | Temp 97.6°F | Ht 64.0 in | Wt 234.0 lb

## 2011-02-23 DIAGNOSIS — S99929A Unspecified injury of unspecified foot, initial encounter: Secondary | ICD-10-CM

## 2011-02-23 DIAGNOSIS — S8991XA Unspecified injury of right lower leg, initial encounter: Secondary | ICD-10-CM

## 2011-02-23 NOTE — Progress Notes (Addendum)
Subjective:    Patient ID: Stacey Chen, female    DOB: 1980/12/24, 30 y.o.   MRN: [id number]  HPI  PCP: Sandford Craze  30 yo F here for right knee injury. Patient reports on Tuesday night 6/12 she went to get some [place] food - while walking back with this, she slipped, fell to ground hyperflexing her right knee with right foot trapped underneath her. Unable to walk immediately following this. Has been limping since. + swelling but no bruising. Pain mostly medial in right knee. No prior surgeries or injuries. Did not hear or feel a pop with this. + instability.  No true locking or catching but has limited motion. Had x-rays that were negative for fracture, ? Small effusion. Has been icing, taking ibuprofen and hydrocodone. Out of work since the injury due to pain and limited motion (works at [employer] and involves lots of walking).  Past Medical History  Diagnosis Date  . Asthma     mild, intermittent  . Anxiety   . Obesity     Current Outpatient Prescriptions on File Prior to Visit  Medication Sig Dispense Refill  . albuterol (VENTOLIN HFA) 108 (90 BASE) MCG/ACT inhaler Inhale 2 puffs into the lungs every 6 (six) hours as needed. For wheezing       . clonazePAM (KLONOPIN) 0.5 MG tablet Take 0.5 mg by mouth every 8 (eight) hours as needed. For anxiety       . fluticasone-salmeterol (ADVAIR HFA) 115-21 MCG/ACT inhaler Inhale 2 puffs into the lungs 2 (two) times daily.        Marland Kitchen HYDROcodone-acetaminophen (NORCO) 5-325 MG per tablet Take 1 tablet by mouth every 6 (six) hours as needed for pain.  30 tablet  0  . ibuprofen (ADVIL,MOTRIN) 600 MG tablet Take 1 tablet (600 mg total) by mouth every 6 (six) hours as needed for pain.  30 tablet  0  . norethindrone-ethinyl estradiol-iron (MICROGESTIN FE1.5/30) 1.5-30 MG-MCG tablet Take 1 tablet by mouth daily.        Marland Kitchen venlafaxine (EFFEXOR) 37.5 MG tablet Take 37.5 mg by mouth 2 (two) times daily.          History reviewed. No  pertinent past surgical history.  No Known Allergies  History   Social History  . Marital Status: Legally Separated    Spouse Name: N/A    Number of Children: 0  . Years of Education: N/A   Occupational History  .     Social History Main Topics  . Smoking status: Never Smoker   . Smokeless tobacco: Never Used  . Alcohol Use: Yes     occasional- very rarely  . Drug Use: Yes     smokes THC  . Sexually Active: Yes -- Female partner(s)   Other Topics Concern  . Not on file   Social History Narrative   Recently separated from husband. In weight loss programRegular exercise:  Yes    Family History  Problem Relation Age of Onset  . Depression Mother   . Alcohol abuse Mother   . Drug abuse Mother   . Drug abuse Father   . Alcohol abuse Maternal Grandmother   . Drug abuse Maternal Grandmother   . Diabetes Maternal Grandmother   . Hypertension Maternal Grandmother   . Alcohol abuse Maternal Grandfather   . Drug abuse Maternal Grandfather   . Hypertension Maternal Grandfather   . Alcohol abuse Paternal Grandmother   . Drug abuse Paternal Grandmother   . Alcohol abuse Paternal  Grandfather   . Drug abuse Paternal Grandfather     BP 112/69  Pulse 70  Temp(Src) 97.6 F (36.4 C) (Oral)  Ht 5\' 4"  (1.626 m)  Wt 234 lb (106.142 kg)  BMI 40.17 kg/m2  LMP 02/09/2011  Review of Systems See HPI above.    Objective:   Physical Exam Gen: NAD, overweight R knee: Mild effusion.  No erythema, gross deformity. TTP medial joint line, through course of MCL.  No lateral joint line, patellar tendon TTP.  Mod TTP Medial posterior patellar facet. ROM 0 - 90 degrees - pain with all motions. Pain but no laxity on valgus stress.  Stable to varus stress.  Negative ant drawer/post drawer, lachmanns - significant guarding due to pain. Mcmurrays and apleys + medial.  Negative laterally. Apprehension causes pain but only mild laxity.    L knee: FROM without pain, swelling,  instability. Assessment & Plan:  1. R knee injury - exam is consistent with Grade 1 MCL sprain and probable medial meniscal tear.  ACL feels intact though patient is guarding quite a bit.  Will get MRI to further assess for meniscal tear and call patient with results.  ACE wrap, elevation, icing, ibuprofen + hydrocodone for swelling and pain in meantime.  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  This should heal up well over the next 4-6 weeks.  Continue with icing, ibuprofen, ace wrap, elevation.  We discussed crutches as she is still in quite a bit of pain - offered she can get them here or over the counter but to let us know.  Given this is a very mild sprain, do not feel she needs the immobilizer to keep her in extension.  Will be out of work at least until next appointment - to follow-up with me in 1 - 1 1/2 weeks for reexamination.  She will fax [place] paperwork for her job to fax back to them.

## 2011-02-23 NOTE — Assessment & Plan Note (Signed)
exam is consistent with Grade 1 MCL sprain and probable medial meniscal tear.  ACL feels intact though patient is guarding quite a bit.  Will get MRI to further assess for meniscal tear and call patient with results.  ACE wrap, elevation, icing, ibuprofen + hydrocodone for swelling and pain in meantime.

## 2011-02-23 NOTE — Patient Instructions (Signed)
I'm concerned you have a medial meniscus tear in your knee from your injury. You also have a mild sprain of your MCL (ligament on the inside of your knee). We will get an MRI to further assess this and call you with the date and time. I'll call you Monday to go over the results and the next steps. Out of work in the meantime. Icing 15 minutes at a time 3-4 times a day. Aleve 1-2 tabs twice a day with food for pain. Elevate above the level of your heart as much as possible. ACE wrap or compression sleeve for support and to keep swelling down.

## 2011-02-24 ENCOUNTER — Ambulatory Visit (HOSPITAL_BASED_OUTPATIENT_CLINIC_OR_DEPARTMENT_OTHER)
Admission: RE | Admit: 2011-02-24 | Discharge: 2011-02-24 | Disposition: A | Discharge status: Home / Self Care | Payer: Private Health Insurance - Indemnity | Source: Ambulatory Visit | Attending: Family Medicine | Admitting: Family Medicine

## 2011-02-24 ENCOUNTER — Encounter: Payer: Self-pay | Admitting: Family Medicine

## 2011-02-24 DIAGNOSIS — M76899 Other specified enthesopathies of unspecified lower limb, excluding foot: Secondary | ICD-10-CM | POA: Insufficient documentation

## 2011-02-24 DIAGNOSIS — M2240 Chondromalacia patellae, unspecified knee: Secondary | ICD-10-CM | POA: Insufficient documentation

## 2011-02-24 DIAGNOSIS — S83419A Sprain of medial collateral ligament of unspecified knee, initial encounter: Secondary | ICD-10-CM | POA: Insufficient documentation

## 2011-02-24 DIAGNOSIS — X58XXXA Exposure to other specified factors, initial encounter: Secondary | ICD-10-CM | POA: Insufficient documentation

## 2011-02-24 DIAGNOSIS — M7120 Synovial cyst of popliteal space [Baker], unspecified knee: Secondary | ICD-10-CM | POA: Insufficient documentation

## 2011-02-24 DIAGNOSIS — X500XXA Overexertion from strenuous movement or load, initial encounter: Secondary | ICD-10-CM

## 2011-02-24 DIAGNOSIS — S8991XA Unspecified injury of right lower leg, initial encounter: Secondary | ICD-10-CM

## 2011-02-24 DIAGNOSIS — M25469 Effusion, unspecified knee: Secondary | ICD-10-CM

## 2011-02-24 NOTE — Assessment & Plan Note (Addendum)
Patient was walking on grass yesterday when her left foot slipped forward and she fell backwards with her right leg underneath her. She's had pain in the right knee with swelling since that time. Had trouble sleeping last night. Is given a prescription for crutches and anti-inflammatories. Asked to remain nonweightbearing apply ice for 15 minutes 3 times a day and sent for x-rays she works on her feet many hours a week so we will keep her out of work for a couple of days and refer her to orthopedics for further. Urine pregnancy run due to patien tis midcycle and xrays are needed, pregnancy test is negative.

## 2011-02-24 NOTE — Progress Notes (Signed)
Stacey Chen [id number] [birth year] 02/24/2011      Progress Note-Follow Up  Subjective  Chief Complaint  Chief Complaint  Patient presents with  . Leg Pain    right leg X 2 days    HPI  She's a 30 year old female who is in today for evaluation of right knee pain. Yesterday she was walking and some slight samples on since the grass when her left foot went for about a 5 French sheath a backwards over her been right leg. She's had knee pain immediately and ever since. She trouble sleeping last night due to the pain. She describes swelling pain in all positions but worse with weightbearing. Any movement causes increased pain in the past. The pain is medially but there is also pain anteriorly and posteriorly. She denies any numbness tingling or weakness down into her foot or any pain in her calf. No chest pain, palpitations, shortness of breath  Past Medical History  Diagnosis Date  . Asthma     mild, intermittent  . Anxiety   . Obesity     History reviewed. No pertinent past surgical history.  Family History  Problem Relation Age of Onset  . Depression Mother   . Alcohol abuse Mother   . Drug abuse Mother   . Drug abuse Father   . Alcohol abuse Maternal Grandmother   . Drug abuse Maternal Grandmother   . Diabetes Maternal Grandmother   . Hypertension Maternal Grandmother   . Alcohol abuse Maternal Grandfather   . Drug abuse Maternal Grandfather   . Hypertension Maternal Grandfather   . Alcohol abuse Paternal Grandmother   . Drug abuse Paternal Grandmother   . Alcohol abuse Paternal Grandfather   . Drug abuse Paternal Grandfather     History   Social History  . Marital Status: Legally Separated    Spouse Name: N/A    Number of Children: 0  . Years of Education: N/A   Occupational History  .     Social History Main Topics  . Smoking status: Never Smoker   . Smokeless tobacco: Never Used  . Alcohol Use: Yes     occasional- very rarely  . Drug Use: Yes   smokes THC  . Sexually Active: Yes -- Female partner(s)   Other Topics Concern  . Not on file   Social History Narrative   Recently separated from husband. In weight loss programRegular exercise:  Yes    Current Outpatient Prescriptions on File Prior to Visit  Medication Sig Dispense Refill  . albuterol (VENTOLIN HFA) 108 (90 BASE) MCG/ACT inhaler Inhale 2 puffs into the lungs every 6 (six) hours as needed. For wheezing       . clonazePAM (KLONOPIN) 0.5 MG tablet Take 0.5 mg by mouth every 8 (eight) hours as needed. For anxiety       . fluticasone-salmeterol (ADVAIR HFA) 115-21 MCG/ACT inhaler Inhale 2 puffs into the lungs 2 (two) times daily.        . norethindrone-ethinyl estradiol-iron (MICROGESTIN FE1.5/30) 1.5-30 MG-MCG tablet Take 1 tablet by mouth daily.        Marland Kitchen venlafaxine (EFFEXOR) 37.5 MG tablet Take 37.5 mg by mouth 2 (two) times daily.          No Known Allergies  Review of Systems  Review of Systems  Constitutional: Negative for fever and malaise/fatigue.  HENT: Negative for congestion.   Eyes: Negative for discharge.  Respiratory: Negative for shortness of breath.   Cardiovascular: Negative for chest pain, palpitations  and leg swelling.  Gastrointestinal: Negative for nausea, abdominal pain and diarrhea.  Genitourinary: Negative for dysuria.  Musculoskeletal: Positive for joint pain. Negative for falls.       Swollen painful right knee  Skin: Negative for rash.  Neurological: Negative for loss of consciousness and headaches.  Endo/Heme/Allergies: Negative for polydipsia.  Psychiatric/Behavioral: Negative for depression and suicidal ideas. The patient is not nervous/anxious and does not have insomnia.     Objective  BP 110/74  Pulse 65  Temp(Src) 97.8 F (36.6 C) (Oral)  Ht 5\' 4"  (1.626 m)  Wt 236 lb 12.8 oz (107.412 kg)  BMI 40.65 kg/m2  SpO2 99%  LMP 02/09/2011  Physical Exam  Physical Exam  Constitutional: She is oriented to person, place, and time  and well-developed, well-nourished, and in no distress. No distress.  HENT:  Head: Normocephalic and atraumatic.  Eyes: Conjunctivae are normal.  Neck: Neck supple. No thyromegaly present.  Cardiovascular: Normal rate, regular rhythm and normal heart sounds.   No murmur heard. Pulmonary/Chest: Effort normal and breath sounds normal. She has no wheezes.  Abdominal: She exhibits no distension and no mass.  Musculoskeletal: She exhibits tenderness. She exhibits no edema.       Painful to palpation over right knee, notably over medial medial aspect but extending posteriorly and anteriorally. Painful with any movement or torsion in any direction. Swelling most notable medially and distally, no erythema or ecchymosis  Lymphadenopathy:    She has no cervical adenopathy.  Neurological: She is alert and oriented to person, place, and time.  Skin: Skin is warm and dry. No rash noted. She is not diaphoretic.  Psychiatric: Memory, affect and judgment normal.    No results found for this basename: TSH   Lab Results  Component Value Date   WBC 10.4 06/06/2010   HGB 13.3 06/06/2010   HCT 39.3 06/06/2010   MCV 83.5 06/06/2010   PLT 352 06/06/2010   Lab Results  Component Value Date   CREATININE .8 06/06/2010   BUN 10 06/06/2010   NA 143 06/06/2010   K 4.3 06/06/2010   CL 110 06/06/2010   CO2 24 06/06/2010     Assessment & Plan  Right knee injury Patient was walking on grass yesterday when her left foot slipped forward and she fell backwards with her right leg underneath her. She's had pain in the right knee with swelling since that time. Had trouble sleeping last night. Is given a prescription for crutches and anti-inflammatories. Asked to remain nonweightbearing apply ice for 15 minutes 3 times a day and sent for x-rays she works on her feet many hours a week so we will keep her out of work for a couple of days and refer her to orthopedics for further. Urine pregnancy run due to patien tis midcycle  and xrays are needed, pregnancy test is negative.

## 2011-03-06 ENCOUNTER — Ambulatory Visit: Payer: Private Health Insurance - Indemnity | Admitting: Family Medicine

## 2011-03-08 ENCOUNTER — Other Ambulatory Visit: Payer: Self-pay | Admitting: Family Medicine

## 2011-03-08 DIAGNOSIS — M25569 Pain in unspecified knee: Secondary | ICD-10-CM

## 2011-03-08 MED ORDER — HYDROCODONE-ACETAMINOPHEN 5-500 MG PO TABS: 1.0000 | ORAL_TABLET | Freq: Four times a day (QID) | ORAL | Status: AC | PRN

## 2011-03-13 ENCOUNTER — Ambulatory Visit (INDEPENDENT_AMBULATORY_CARE_PROVIDER_SITE_OTHER): Payer: Private Health Insurance - Indemnity | Admitting: Family Medicine

## 2011-03-13 ENCOUNTER — Encounter: Payer: Self-pay | Admitting: Family Medicine

## 2011-03-13 VITALS — BP 115/86 | HR 68 | Temp 98.8°F | Ht 64.0 in | Wt 237.0 lb

## 2011-03-13 DIAGNOSIS — S8990XA Unspecified injury of unspecified lower leg, initial encounter: Secondary | ICD-10-CM

## 2011-03-13 DIAGNOSIS — S8991XA Unspecified injury of right lower leg, initial encounter: Secondary | ICD-10-CM

## 2011-03-13 NOTE — Patient Instructions (Signed)
Start physical therapy and go 1-2 times a week for up to 6 weeks. Make sure you do home exercises as directed by them as well. Continue icing, anti-inflammatories as you have been. Crutches if needed. Continue out of work. Use knee brace for support. Follow up with me in 3 weeks for a recheck to see if you can return to work at that time. Be careful to avoid any sudden movements, twisting of your knee.

## 2011-03-15 ENCOUNTER — Encounter: Payer: Self-pay | Admitting: Family Medicine

## 2011-03-15 NOTE — Assessment & Plan Note (Signed)
MRI confirmed MCL sprain and also showed medial retinacular sprain without tear.  Knee brace provided today for more stability.  Discussed avoiding painful activities and important she not do any twisting or sudden moves of her knee to cause reinjury.  Start physical therapy for strengthening.  Discussed we could consider pes injection as she is tender here too and has bursitis on MRI - would consider at f/u in 3 weeks if not improving.  Continue elevation, icing, ibuprofen + hydrocodone for swelling and pain in meantime.

## 2011-03-15 NOTE — Progress Notes (Signed)
Subjective:    Patient ID: Stacey Chen, female    DOB: 1981/04/05, 30 y.o.   MRN: [id number]  PCP: Sandford Craze  HPI  Last OV 6/15: 30 yo F here for right knee injury. Patient reports on Tuesday night 6/12 she went to get some [place] food - while walking back with this, she slipped, fell to ground hyperflexing her right knee with right foot trapped underneath her. Unable to walk immediately following this. Has been limping since. + swelling but no bruising. Pain mostly medial in right knee. No prior surgeries or injuries. Did not hear or feel a pop with this. + instability.  No true locking or catching but has limited motion. Had x-rays that were negative for fracture, ? Small effusion. Has been icing, taking ibuprofen and hydrocodone. Out of work since the injury due to pain and limited motion (works at [employer] and involves lots of walking).  Today 7/3: Patient has improved but states a week ago she felt like she tweaked knee in same area when forcefully trying to open a car door. Has been icing, taking nsaids, using ace wrap, elevating. Using a cane instead of crutches. Still uncomfortable with sleeping and aches medially. No giving out.  No true catching, locking.  Motion better. Is out of work.  Past Medical History  Diagnosis Date  . Asthma     mild, intermittent  . Anxiety   . Obesity     Current Outpatient Prescriptions on File Prior to Visit  Medication Sig Dispense Refill  . albuterol (VENTOLIN HFA) 108 (90 BASE) MCG/ACT inhaler Inhale 2 puffs into the lungs every 6 (six) hours as needed. For wheezing       . clonazePAM (KLONOPIN) 0.5 MG tablet Take 0.5 mg by mouth every 8 (eight) hours as needed. For anxiety       . fluticasone-salmeterol (ADVAIR HFA) 115-21 MCG/ACT inhaler Inhale 2 puffs into the lungs 2 (two) times daily.        Marland Kitchen HYDROcodone-acetaminophen (VICODIN) 5-500 MG per tablet Take 1 tablet by mouth every 6 (six) hours as needed for pain.   60 tablet  0  . norethindrone-ethinyl estradiol-iron (MICROGESTIN FE1.5/30) 1.5-30 MG-MCG tablet Take 1 tablet by mouth daily.        Marland Kitchen venlafaxine (EFFEXOR) 37.5 MG tablet Take 37.5 mg by mouth 2 (two) times daily.          History reviewed. No pertinent past surgical history.  No Known Allergies  History   Social History  . Marital Status: Legally Separated    Spouse Name: N/A    Number of Children: 0  . Years of Education: N/A   Occupational History  .     Social History Main Topics  . Smoking status: Never Smoker   . Smokeless tobacco: Never Used  . Alcohol Use: Yes     occasional- very rarely  . Drug Use: Yes     smokes THC  . Sexually Active: Yes -- Female partner(s)   Other Topics Concern  . Not on file   Social History Narrative   Recently separated from husband. In weight loss programRegular exercise:  Yes    Family History  Problem Relation Age of Onset  . Depression Mother   . Alcohol abuse Mother   . Drug abuse Mother   . Drug abuse Father   . Alcohol abuse Maternal Grandmother   . Drug abuse Maternal Grandmother   . Diabetes Maternal Grandmother   . Hypertension Maternal Grandmother   .  Alcohol abuse Maternal Grandfather   . Drug abuse Maternal Grandfather   . Hypertension Maternal Grandfather   . Alcohol abuse Paternal Grandmother   . Drug abuse Paternal Grandmother   . Alcohol abuse Paternal Grandfather   . Drug abuse Paternal Grandfather     BP 115/86  Pulse 68  Temp(Src) 98.8 F (37.1 C) (Oral)  Ht 5\' 4"  (1.626 m)  Wt 237 lb (107.502 kg)  BMI 40.68 kg/m2  LMP 02/09/2011  Review of Systems  See HPI above.    Objective:   Physical Exam  Gen: NAD, overweight R knee: No effusion.  No erythema, gross deformity. TTP medial joint line, through course of MCL.  No lateral joint line, patellar tendon TTP.  Mild TTP Medial posterior patellar facet. ROM 0 - 110 degrees. Mild Pain but no laxity on valgus stress.  Stable to varus stress.   Negative ant drawer/post drawer, lachmanns - significant guarding due to pain. Mcmurrays and apleys + medial.  Negative laterally. Apprehension causes pain but only mild laxity.    L knee: FROM without pain, swelling, instability. Assessment & Plan:  1. R knee injury - MRI confirmed MCL sprain and also showed medial retinacular sprain without tear.  Knee brace provided today for more stability.  Discussed avoiding painful activities and important she not do any twisting or sudden moves of her knee to cause reinjury.  Start physical therapy for strengthening.  Discussed we could consider pes injection as she is tender here too and has bursitis on MRI - would consider at f/u in 3 weeks if not improving.  Continue elevation, icing, ibuprofen + hydrocodone for swelling and pain in meantime.

## 2011-03-20 ENCOUNTER — Ambulatory Visit: Payer: Private Health Insurance - Indemnity | Admitting: Physical Therapy

## 2011-03-23 ENCOUNTER — Ambulatory Visit: Payer: Private Health Insurance - Indemnity | Admitting: Family

## 2011-03-28 ENCOUNTER — Telehealth: Payer: Self-pay | Admitting: Family Medicine

## 2011-03-28 NOTE — Telephone Encounter (Signed)
Called to speak with patient as I received a PT notification that she scheduled an appointment for 7/17

## 2011-03-28 NOTE — Telephone Encounter (Signed)
Called to speak with patients as I received a PT notification that she scheduled her appointment for 7/17, 2 weeks out from her last appointment with me, then canceled due to a high deductible.  She has been out of work since injury and due to go back 7/24.  States still having pain.  Checked with PT location and they are not booked 2 weeks in advance - patient just made her first appointment 2 weeks out from her appointment with me.  Discussed that based on her MRI there is no further reason to keep her out of work beyond the above date.  She is coming in for an appointment on Friday and I will show her home exercise program.

## 2011-03-30 ENCOUNTER — Encounter: Payer: Self-pay | Admitting: Family Medicine

## 2011-03-30 ENCOUNTER — Ambulatory Visit: Payer: Private Health Insurance - Indemnity | Admitting: Family Medicine

## 2011-03-30 ENCOUNTER — Ambulatory Visit (INDEPENDENT_AMBULATORY_CARE_PROVIDER_SITE_OTHER): Payer: Private Health Insurance - Indemnity | Admitting: Family Medicine

## 2011-03-30 VITALS — BP 134/86 | HR 102 | Temp 98.2°F | Ht 64.0 in | Wt 236.0 lb

## 2011-03-30 DIAGNOSIS — S8990XA Unspecified injury of unspecified lower leg, initial encounter: Secondary | ICD-10-CM

## 2011-03-30 DIAGNOSIS — S8991XA Unspecified injury of right lower leg, initial encounter: Secondary | ICD-10-CM

## 2011-03-30 NOTE — Patient Instructions (Signed)
You still have some soreness on the inside of your kneecap. As you strengthen the muscles of your quad and hamstring, the pain will continue to improve. We will return you to work on 7/24 but with some restrictions (no squatting past 90 degrees, no crawling). Do the exercises I show you every day twice a day. Straight leg raises, straight leg raises with foot turned outward, hip side raises, hamstring curls, hamstring swings 3 sets of 10 of each of these.  When they become too easy, add a 5 pound ankle weight (can buy these at [employer]). Ice your knee 15 minutes at a time 3-4 times a day and after work. Wear your knee brace at work. Follow up with me in 3 weeks for a recheck on your progress.

## 2011-03-30 NOTE — Assessment & Plan Note (Signed)
2/2 MCL, medial retinacular sprain and chondromalacia.  Pes bursitis has resolved (had previously discussed considering injection to this area but no pain here now).  She was put through a period of rest for about 3 weeks then was to start PT but she has not gone and I was not informed until 2 weeks following her last appointment that she couldn't afford this (we would have started a home exercise program at that time and her pain would likely be improved).  Natural history of MCL, medial retinacular sprains typically take up to 6 weeks to recover from.  Her job was notified of this as well.  We will return her to work at 6 weeks out from her injury with only restrictions being no squatting past 90 degrees, no crawling.  Must wear knee brace at work.  She will start home exercise program for quad and hamstring strengthening (with VMO exercises).  Ligaments healed by 6 weeks out, remaining pain related to muscle weakness, chondromalacia but cannot keep her out of work just because she has not followed through on rehab program as she was directed.  Continue icing and ibuprofen.  F/u with me in 3 weeks for a recheck.  No objective reasons to keep her out of work beyond 6 weeks based on her injuries sustained and confirmed by MRI.

## 2011-03-30 NOTE — Progress Notes (Signed)
Subjective:    Patient ID: Stacey Chen, female    DOB: [birth year], 30 y.o.   MRN: [id number]  PCP: Stacey Chen  HPI  6/15: 30 yo F here for right knee injury. Patient reports on Tuesday night 6/12 she went to get some [place] food - while walking back with this, she slipped, fell to ground hyperflexing her right knee with right foot trapped underneath her. Unable to walk immediately following this. Has been limping since. + swelling but no bruising. Pain mostly medial in right knee. No prior surgeries or injuries. Did not hear or feel a pop with this. + instability.  No true locking or catching but has limited motion. Had x-rays that were negative for fracture, ? Small effusion. Has been icing, taking ibuprofen and hydrocodone. Out of work since the injury due to pain and limited motion (works at [employer] and involves lots of walking).  7/3: Patient has improved but states a week ago she felt like she tweaked knee in same area when forcefully trying to open a car door. Has been icing, taking nsaids, using ace wrap, elevating. Using a cane instead of crutches. Still uncomfortable with sleeping and aches medially. No giving out.  No true catching, locking.  Motion better. Is out of work.  7/20: Received note from physical therapy that patient cancelled her initial appointment (she scheduled for 7/17) due to deductible so has not been receiving PT (see my telephone note). Approximately 5 1/2 weeks s/p her initial injury (medial retinacular sprain, MCl sprain, traumatic pes bursitis, chondromalacia). Reports she has improved some but pain still 4/10. Now able to walk though with some pain. Is icing and taking ibuprofen. Pain worse when walking and at bedtime feels throbbing pain. Has hydrocodone for severe pain as well that she takes sometimes. Occasional swelling.  Past Medical History  Diagnosis Date  . Asthma     mild, intermittent  . Anxiety   . Obesity      Current Outpatient Prescriptions on File Prior to Visit  Medication Sig Dispense Refill  . albuterol (VENTOLIN HFA) 108 (90 BASE) MCG/ACT inhaler Inhale 2 puffs into the lungs every 6 (six) hours as needed. For wheezing       . clonazePAM (KLONOPIN) 0.5 MG tablet Take 0.5 mg by mouth every 8 (eight) hours as needed. For anxiety       . fluticasone-salmeterol (ADVAIR HFA) 115-21 MCG/ACT inhaler Inhale 2 puffs into the lungs 2 (two) times daily.        . norethindrone-ethinyl estradiol-iron (MICROGESTIN FE1.5/30) 1.5-30 MG-MCG tablet Take 1 tablet by mouth daily.        Marland Kitchen venlafaxine (EFFEXOR) 37.5 MG tablet Take 37.5 mg by mouth 2 (two) times daily.          History reviewed. No pertinent past surgical history.  No Known Allergies  History   Social History  . Marital Status: Legally Separated    Spouse Name: N/A    Number of Children: 0  . Years of Education: N/A   Occupational History  .     Social History Main Topics  . Smoking status: Never Smoker   . Smokeless tobacco: Never Used  . Alcohol Use: Yes     occasional- very rarely  . Drug Use: Yes     smokes THC  . Sexually Active: Yes -- Female partner(s)   Other Topics Concern  . Not on file   Social History Narrative   Recently separated from husband. In weight loss  programRegular exercise:  Yes    Family History  Problem Relation Age of Onset  . Depression Mother   . Alcohol abuse Mother   . Drug abuse Mother   . Drug abuse Father   . Alcohol abuse Maternal Grandmother   . Drug abuse Maternal Grandmother   . Diabetes Maternal Grandmother   . Hypertension Maternal Grandmother   . Alcohol abuse Maternal Grandfather   . Drug abuse Maternal Grandfather   . Hypertension Maternal Grandfather   . Alcohol abuse Paternal Grandmother   . Drug abuse Paternal Grandmother   . Alcohol abuse Paternal Grandfather   . Drug abuse Paternal Grandfather     BP 134/86  Pulse 102  Temp(Src) 98.2 F (36.8 C) (Oral)  Ht  5\' 4"  (1.626 m)  Wt 236 lb (107.049 kg)  BMI 40.51 kg/m2  Review of Systems  See HPI above.    Objective:   Physical Exam  Gen: NAD, overweight R knee: No effusion.  No erythema, gross deformity. TTP medially just inferior to medial joint line.  No lateral joint line, patellar tendon TTP.  Mild TTP Medial posterior patellar facet.  No TTP pes anserine area. ROM 0 - 110 degrees. Mild Pain but no laxity on valgus stress.  Stable to varus stress.  Negative ant drawer/post drawer, lachmanns negative.Stacey Chen with medial pain but no click.  Apprehension causes pain but only mild laxity.    L knee: FROM without pain, swelling, instability. Assessment & Plan:  1. R knee injury - 2/2 MCL, medial retinacular sprain and chondromalacia.  Pes bursitis has resolved (had previously discussed considering injection to this area but no pain here now).  She was put through a period of rest for about 3 weeks then was to start PT but she has not gone and I was not informed until 2 weeks following her last appointment that she couldn't afford this (we would have started a home exercise program at that time and her pain would likely be improved).  Natural history of MCL, medial retinacular sprains typically take up to 6 weeks to recover from.  Her job was notified of this as well.  We will return her to work at 6 weeks out from her injury with only restrictions being no squatting past 90 degrees, no crawling.  Must wear knee brace at work.  She will start home exercise program for quad and hamstring strengthening (with VMO exercises).  Ligaments healed by 6 weeks out, remaining pain related to muscle weakness, chondromalacia but cannot keep her out of work just because she has not followed through on rehab program as she was directed.  Continue icing and ibuprofen.  F/u with me in 3 weeks for a recheck.  No objective reasons to keep her out of work beyond 6 weeks based on her injuries sustained and confirmed by  MRI.

## 2011-04-03 ENCOUNTER — Ambulatory Visit: Payer: Private Health Insurance - Indemnity | Admitting: Family Medicine

## 2011-04-06 ENCOUNTER — Ambulatory Visit (INDEPENDENT_AMBULATORY_CARE_PROVIDER_SITE_OTHER): Payer: Private Health Insurance - Indemnity | Admitting: Family

## 2011-04-06 ENCOUNTER — Encounter: Payer: Self-pay | Admitting: Family

## 2011-04-06 VITALS — BP 106/68 | HR 90 | Temp 98.7°F | Resp 16 | Ht 64.0 in | Wt 237.1 lb

## 2011-04-06 DIAGNOSIS — M25561 Pain in right knee: Secondary | ICD-10-CM

## 2011-04-06 DIAGNOSIS — S8991XA Unspecified injury of right lower leg, initial encounter: Secondary | ICD-10-CM

## 2011-04-06 DIAGNOSIS — M25569 Pain in unspecified knee: Secondary | ICD-10-CM

## 2011-04-06 MED ORDER — MELOXICAM 7.5 MG PO TABS: 7.5000 mg | ORAL_TABLET | Freq: Every day | ORAL | Status: DC

## 2011-04-06 NOTE — Assessment & Plan Note (Signed)
She is requesting that I write her out of work.  I told her that Dr. Pearletha Forge is a sports medicine specialist and that if he feels that she is medically ready to return to work that I do not have much more to add.  She is requesting a second opinion.  I will arrange an orthopedic referral early next week.  I also recommended to her that she try to purchase some loose pants that will allow her to wear her brace at work as this should help her.  Will add Mobic. I advised her against continuing hydrocodone at this point. She verbalized understanding.

## 2011-04-06 NOTE — Progress Notes (Signed)
Subjective:    Patient ID: Stacey Chen, female    DOB: [birth year], 30 y.o.   MRN: [id number]  HPI  Stacey Chen is a 30 yr old female who presents today for follow up on her knee injury.  She reports that she fell on 6/13, slipped on grass.  She had an MRI which showed MCL and medial retinacular sprain.  She met with Dr. Pearletha Forge who recommended that she start PT and authorized her to be out of work for 6 weeks.  She was unable to do PT as her deductible was $500.   Went back to work yesterday.  Felt that her knee kept "giving out on her."  When she got home her knee was swollen. She has a knee brace but is having trouble getting it over her jeans.  She has been using hydrocodone for pain.      Review of Systems See HPI  Past Medical History  Diagnosis Date  . Asthma     mild, intermittent  . Anxiety   . Obesity     History   Social History  . Marital Status: Legally Separated    Spouse Name: N/A    Number of Children: 0  . Years of Education: N/A   Occupational History  .     Social History Main Topics  . Smoking status: Never Smoker   . Smokeless tobacco: Never Used  . Alcohol Use: Yes     occasional- very rarely  . Drug Use: Yes     smokes THC  . Sexually Active: Yes -- Female partner(s)   Other Topics Concern  . Not on file   Social History Narrative   Recently separated from husband. In weight loss programRegular exercise:  Yes    No past surgical history on file.  Family History  Problem Relation Age of Onset  . Depression Mother   . Alcohol abuse Mother   . Drug abuse Mother   . Drug abuse Father   . Alcohol abuse Maternal Grandmother   . Drug abuse Maternal Grandmother   . Diabetes Maternal Grandmother   . Hypertension Maternal Grandmother   . Alcohol abuse Maternal Grandfather   . Drug abuse Maternal Grandfather   . Hypertension Maternal Grandfather   . Alcohol abuse Paternal Grandmother   . Drug abuse Paternal Grandmother   . Alcohol  abuse Paternal Grandfather   . Drug abuse Paternal Grandfather     No Known Allergies  Current Outpatient Prescriptions on File Prior to Visit  Medication Sig Dispense Refill  . albuterol (VENTOLIN HFA) 108 (90 BASE) MCG/ACT inhaler Inhale 2 puffs into the lungs every 6 (six) hours as needed. For wheezing       . venlafaxine (EFFEXOR) 37.5 MG tablet Take 37.5 mg by mouth 2 (two) times daily.          BP 106/68  Pulse 90  Temp(Src) 98.7 F (37.1 C) (Oral)  Resp 16  Ht 5\' 4"  (1.626 m)  Wt 237 lb 1.3 oz (107.539 kg)  BMI 40.69 kg/m2  LMP 04/06/2011       Objective:   Physical Exam  Constitutional: She is oriented to person, place, and time. She appears well-developed and well-nourished.  Cardiovascular: Normal rate and regular rhythm.   Pulmonary/Chest: Effort normal and breath sounds normal.  Abdominal: Soft.  Neurological: She is alert and oriented to person, place, and time.  Skin: Skin is warm and dry.  Psychiatric: She has a normal mood and affect.  Her behavior is normal. Judgment and thought content normal.  MS:  R knee with mild swelling, + tenderness medially.        Assessment & Plan:

## 2011-04-20 ENCOUNTER — Ambulatory Visit: Payer: Private Health Insurance - Indemnity | Admitting: Family Medicine

## 2011-08-29 ENCOUNTER — Emergency Department (HOSPITAL_COMMUNITY)
Admission: EM | Admit: 2011-08-29 | Discharge: 2011-08-30 | Disposition: A | Discharge status: Home / Self Care | Payer: Self-pay | Attending: Emergency Medicine | Admitting: Emergency Medicine

## 2011-08-29 ENCOUNTER — Encounter (HOSPITAL_COMMUNITY): Payer: Self-pay | Admitting: Emergency Medicine

## 2011-08-29 DIAGNOSIS — R42 Dizziness and giddiness: Secondary | ICD-10-CM | POA: Insufficient documentation

## 2011-08-29 DIAGNOSIS — R51 Headache: Secondary | ICD-10-CM | POA: Insufficient documentation

## 2011-08-29 DIAGNOSIS — R110 Nausea: Secondary | ICD-10-CM | POA: Insufficient documentation
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
## 2011-08-29 NOTE — ED Notes (Signed)
Pt states her headache is like a throbbing pain and causes her to be sensative to light  Pt states she has also been very tired lately even when she gets a full nights rest  Pt states she is having urinary frequency too

## 2011-08-30 LAB — POCT I-STAT, CHEM 8
BUN: 10 mg/dL (ref 6–23)
Calcium, Ion: 1.25 mmol/L (ref 1.12–1.32)
Chloride: 105 meq/L (ref 96–112)
Creatinine, Ser: 0.9 mg/dL (ref 0.50–1.10)
Glucose, Bld: 89 mg/dL (ref 70–99)
HCT: 39 % (ref 36.0–46.0)
Hemoglobin: 13.3 g/dL (ref 12.0–15.0)
Potassium: 3.6 meq/L (ref 3.5–5.1)
Sodium: 141 meq/L (ref 135–145)
TCO2: 27 mmol/L (ref 0–100)

## 2011-08-30 LAB — URINALYSIS, DIPSTICK ONLY
Bilirubin Urine: NEGATIVE
Glucose, UA: NEGATIVE mg/dL
Hgb urine dipstick: NEGATIVE
Ketones, ur: NEGATIVE mg/dL
Protein, ur: NEGATIVE mg/dL

## 2011-08-30 LAB — GLUCOSE, CAPILLARY

## 2011-08-30 MED ORDER — KETOROLAC TROMETHAMINE 60 MG/2ML IM SOLN
60.0000 mg | Freq: Once | INTRAMUSCULAR | Status: AC
  Administered 2011-08-30: 60 mg via INTRAMUSCULAR
  Filled 2011-08-30: qty 2

## 2011-08-30 MED ORDER — MECLIZINE HCL 25 MG PO TABS: 25.0000 mg | ORAL_TABLET | Freq: Four times a day (QID) | ORAL | Status: AC

## 2011-08-30 NOTE — ED Provider Notes (Signed)
History     CSN: [id number] Arrival date & time: 08/29/2011  7:10 PM   First MD Initiated Contact with Patient 08/29/11 2352      Chief Complaint  Patient presents with  . Dizziness  . Headache  . Nausea    (Consider location/radiation/quality/duration/timing/severity/associated sxs/prior treatment) HPI Comments: 30 year old female with a history of asthma who developed flulike symptoms approximately 10 days ago, was placed on prednisone by an outside hospital, symptoms resolved after 3 days. Since that time she has developed [place] headaches, intermittent vertigo with nausea. This seems to last a minute or 2, is worsened with moving her head from side to side and changing position and improved with holding still. This is associated with sharp and throbbing headaches. The symptoms are new she has never had them before. She denies any tinnitus, decreased hearing, fevers chills change in vision abdominal pain chest pain or shortness of breath. She does admit to having myalgias and urinary frequency with increased thirst.  The history is provided by the patient.    Past Medical History  Diagnosis Date  . Asthma     mild, intermittent  . Anxiety   . Obesity     History reviewed. No pertinent past surgical history.  Family History  Problem Relation Age of Onset  . Depression Mother   . Alcohol abuse Mother   . Drug abuse Mother   . Drug abuse Father   . Alcohol abuse Maternal Grandmother   . Drug abuse Maternal Grandmother   . Diabetes Maternal Grandmother   . Hypertension Maternal Grandmother   . Alcohol abuse Maternal Grandfather   . Drug abuse Maternal Grandfather   . Hypertension Maternal Grandfather   . Alcohol abuse Paternal Grandmother   . Drug abuse Paternal Grandmother   . Alcohol abuse Paternal Grandfather   . Drug abuse Paternal Grandfather     History  Substance Use Topics  . Smoking status: Current Some Day Smoker  . Smokeless tobacco: Never Used  .  Alcohol Use: Yes     occasional- very rarely    OB History    Grav Para Term Preterm Abortions TAB SAB Ect Mult Living                  Review of Systems  All other systems reviewed and are negative.    Allergies  Review of patient's allergies indicates no known allergies.  Home Medications   Current Outpatient Rx  Name Route Sig Dispense Refill  . ALBUTEROL SULFATE HFA 108 (90 BASE) MCG/ACT IN AERS Inhalation Inhale 2 puffs into the lungs every 6 (six) hours as needed. For wheezing     . MECLIZINE HCL 25 MG PO TABS Oral Take 1 tablet (25 mg total) by mouth 4 (four) times daily. 28 tablet 0  . MELOXICAM 7.5 MG PO TABS Oral Take 1 tablet (7.5 mg total) by mouth daily. 30 tablet 0  . VENLAFAXINE HCL 37.5 MG PO TABS Oral Take 37.5 mg by mouth 2 (two) times daily.        BP 129/85  Pulse 86  Temp(Src) 99.5 F (37.5 C) (Oral)  Resp 20  SpO2 98%  LMP 08/18/2011  Physical Exam  Nursing note and vitals reviewed. Constitutional: She appears well-developed and well-nourished. No distress.  HENT:  Head: Normocephalic and atraumatic.  Right Ear: External ear normal.  Left Ear: External ear normal.  Mouth/Throat: Oropharynx is clear and moist. No oropharyngeal exudate.  Eyes: Conjunctivae and EOM are normal. Pupils  are equal, round, and reactive to light. Right eye exhibits no discharge. Left eye exhibits no discharge. No scleral icterus.  Neck: Normal range of motion. Neck supple. No JVD present. No thyromegaly present.  Cardiovascular: Normal rate, regular rhythm, normal heart sounds and intact distal pulses.  Exam reveals no gallop and no friction rub.   No murmur heard. Pulmonary/Chest: Effort normal and breath sounds normal. No respiratory distress. She has no wheezes. She has no rales.  Abdominal: Soft. Bowel sounds are normal. She exhibits no distension and no mass. There is no tenderness.  Musculoskeletal: Normal range of motion. She exhibits no edema and no tenderness.    Lymphadenopathy:    She has no cervical adenopathy.  Neurological: She is alert. Coordination normal.  Skin: Skin is warm and dry. No rash noted. No erythema.  Psychiatric: She has a normal mood and affect. Her behavior is normal.    ED Course  Procedures (including critical care time)   Labs Reviewed  URINALYSIS, DIPSTICK ONLY  PREGNANCY, URINE  GLUCOSE, CAPILLARY  POCT CBG MONITORING  I-STAT, CHEM 8   No results found.   1. Headache   2. Vertigo       MDM  No motion induced nystagmus or vertigo on exam, patient has headache at this time but no other complaints. Per history it is consistent with peripheral vertigo, check CBG to rule out hyperglycemia. Intramuscular Toradol for her headache.      Improved with Toradol, UA normal, CBG normal, will d/c home.  Likely peripheral vertigo.  Vida Roller, MD 08/30/11 [phone]

## 2011-08-30 NOTE — ED Notes (Signed)
Dr. Miller @ bedside.

## 2011-09-14 ENCOUNTER — Emergency Department (HOSPITAL_BASED_OUTPATIENT_CLINIC_OR_DEPARTMENT_OTHER)
Admission: EM | Admit: 2011-09-14 | Discharge: 2011-09-14 | Disposition: A | Discharge status: Home / Self Care | Payer: Private Health Insurance - Indemnity | Attending: Emergency Medicine | Admitting: Emergency Medicine

## 2011-09-14 ENCOUNTER — Encounter (HOSPITAL_BASED_OUTPATIENT_CLINIC_OR_DEPARTMENT_OTHER): Payer: Self-pay | Admitting: *Deleted

## 2011-09-14 DIAGNOSIS — J45909 Unspecified asthma, uncomplicated: Secondary | ICD-10-CM | POA: Insufficient documentation

## 2011-09-14 DIAGNOSIS — S39012A Strain of muscle, fascia and tendon of lower back, initial encounter: Secondary | ICD-10-CM

## 2011-09-14 DIAGNOSIS — S335XXA Sprain of ligaments of lumbar spine, initial encounter: Secondary | ICD-10-CM | POA: Insufficient documentation

## 2011-09-14 DIAGNOSIS — Y92410 Unspecified street and highway as the place of occurrence of the external cause: Secondary | ICD-10-CM | POA: Insufficient documentation

## 2011-09-14 DIAGNOSIS — E669 Obesity, unspecified: Secondary | ICD-10-CM | POA: Insufficient documentation

## 2011-09-14 MED ORDER — OXYCODONE-ACETAMINOPHEN 5-325 MG PO TABS: 1.0000 | ORAL_TABLET | Freq: Four times a day (QID) | ORAL | Status: AC | PRN

## 2011-09-14 MED ORDER — CYCLOBENZAPRINE HCL 10 MG PO TABS: 10.0000 mg | ORAL_TABLET | Freq: Two times a day (BID) | ORAL | Status: AC | PRN

## 2011-09-14 MED ORDER — IBUPROFEN 600 MG PO TABS: 600.0000 mg | ORAL_TABLET | Freq: Four times a day (QID) | ORAL | Status: AC | PRN
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
## 2011-11-02 ENCOUNTER — Encounter (HOSPITAL_BASED_OUTPATIENT_CLINIC_OR_DEPARTMENT_OTHER): Payer: Self-pay | Admitting: *Deleted

## 2011-11-02 ENCOUNTER — Emergency Department (HOSPITAL_BASED_OUTPATIENT_CLINIC_OR_DEPARTMENT_OTHER)
Admission: EM | Admit: 2011-11-02 | Discharge: 2011-11-02 | Disposition: A | Discharge status: Home / Self Care | Payer: Self-pay | Attending: Emergency Medicine | Admitting: Emergency Medicine

## 2011-11-02 DIAGNOSIS — J45909 Unspecified asthma, uncomplicated: Secondary | ICD-10-CM | POA: Insufficient documentation

## 2011-11-02 DIAGNOSIS — E669 Obesity, unspecified: Secondary | ICD-10-CM | POA: Insufficient documentation

## 2011-11-02 DIAGNOSIS — J069 Acute upper respiratory infection, unspecified: Secondary | ICD-10-CM | POA: Insufficient documentation

## 2011-11-02 DIAGNOSIS — R059 Cough, unspecified: Secondary | ICD-10-CM | POA: Insufficient documentation

## 2011-11-02 DIAGNOSIS — F411 Generalized anxiety disorder: Secondary | ICD-10-CM | POA: Insufficient documentation

## 2011-11-02 DIAGNOSIS — R05 Cough: Secondary | ICD-10-CM | POA: Insufficient documentation

## 2011-11-02 MED ORDER — OXYCODONE-ACETAMINOPHEN 5-325 MG PO TABS
ORAL_TABLET | ORAL | Status: AC
  Filled 2011-11-02: qty 1

## 2011-11-02 MED ORDER — OXYCODONE-ACETAMINOPHEN 5-325 MG PO TABS
1.0000 | ORAL_TABLET | Freq: Once | ORAL | Status: AC
  Administered 2011-11-02: 1 via ORAL

## 2011-11-02 NOTE — ED Notes (Signed)
Pt reports onset of runny nose, cough, and congestion 1 week ago.

## 2011-11-02 NOTE — ED Provider Notes (Signed)
History     CSN: [id number]  Arrival date & time 11/02/11  1857   First MD Initiated Contact with Patient 11/02/11 1940      Chief Complaint  Patient presents with  . Nasal Congestion  . Cough    (Consider location/radiation/quality/duration/timing/severity/associated sxs/prior treatment) The history is provided by the patient.   patient reports to 4 days of cough congestion myalgias sore throat headache and ear pain.  She's had chills without fever.  She denies shortness of breath.  She denies abdominal pain.  She denies nausea vomiting and diarrhea.  She reports multiple sick contacts at work.  She reports still is not feeling well today.  Nothing worsens her symptoms.  Nothing improves her symptoms.  Her symptoms are constant.  Symptoms are mild to moderate in severity  Past Medical History  Diagnosis Date  . Asthma     mild, intermittent  . Anxiety   . Obesity     History reviewed. No pertinent past surgical history.  Family History  Problem Relation Age of Onset  . Depression Mother   . Alcohol abuse Mother   . Drug abuse Mother   . Drug abuse Father   . Alcohol abuse Maternal Grandmother   . Drug abuse Maternal Grandmother   . Diabetes Maternal Grandmother   . Hypertension Maternal Grandmother   . Alcohol abuse Maternal Grandfather   . Drug abuse Maternal Grandfather   . Hypertension Maternal Grandfather   . Alcohol abuse Paternal Grandmother   . Drug abuse Paternal Grandmother   . Alcohol abuse Paternal Grandfather   . Drug abuse Paternal Grandfather     History  Substance Use Topics  . Smoking status: Current Some Day Smoker  . Smokeless tobacco: Never Used  . Alcohol Use: Yes     occasional- very rarely    OB History    Grav Para Term Preterm Abortions TAB SAB Ect Mult Living                  Review of Systems  All other systems reviewed and are negative.    Allergies  Review of patient's allergies indicates no known allergies.  Home  Medications   Current Outpatient Rx  Name Route Sig Dispense Refill  . ACETAMINOPHEN 500 MG PO TABS Oral Take 1,000 mg by mouth every 4 (four) hours as needed. As needed for pain.    . ASPIRIN-ACETAMINOPHEN-CAFFEINE 250-250-65 MG PO TABS Oral Take 2 tablets by mouth every 4 (four) hours as needed. As needed for pain.    Marland Kitchen VICKS VAPORUB EX Apply externally Apply 1 application topically 3 (three) times daily as needed. As needed for congestion relief.      BP 137/79  Pulse 73  Temp(Src) 98 F (36.7 C) (Oral)  Resp 20  SpO2 100%  LMP 10/12/2011  Physical Exam  Nursing note and vitals reviewed. Constitutional: She is oriented to person, place, and time. She appears well-developed and well-nourished. No distress.  HENT:  Head: Normocephalic and atraumatic.  Eyes: EOM are normal.  Neck: Normal range of motion.  Cardiovascular: Normal rate, regular rhythm and normal heart sounds.   Pulmonary/Chest: Effort normal and breath sounds normal.  Abdominal: Soft. She exhibits no distension. There is no tenderness.  Musculoskeletal: Normal range of motion.  Neurological: She is alert and oriented to person, place, and time.  Skin: Skin is warm and dry.  Psychiatric: She has a normal mood and affect. Judgment normal.    ED Course  Procedures (  including critical care time)  Labs Reviewed - No data to display No results found.   1. Upper respiratory tract infection       MDM  Likely viral upper respiratory tract infections.  The patient is well-appearing.  She is nontoxic.  No hypoxia on exam.  Lung exam is clear.  Normal work of breathing.  No indication for chest x-ray.  Close followup with PCP         Lyanne Co, MD 11/02/11 2002

## 2011-11-22 ENCOUNTER — Inpatient Hospital Stay (HOSPITAL_COMMUNITY): Payer: Medicaid Other

## 2011-11-22 ENCOUNTER — Encounter (HOSPITAL_COMMUNITY): Payer: Self-pay | Admitting: *Deleted

## 2011-11-22 ENCOUNTER — Inpatient Hospital Stay (HOSPITAL_COMMUNITY)
Admission: AD | Admit: 2011-11-22 | Discharge: 2011-11-22 | Disposition: A | Discharge status: Home / Self Care | Payer: Medicaid Other | Source: Ambulatory Visit | Attending: Obstetrics & Gynecology | Admitting: Obstetrics & Gynecology

## 2011-11-22 DIAGNOSIS — O219 Vomiting of pregnancy, unspecified: Secondary | ICD-10-CM

## 2011-11-22 DIAGNOSIS — O210 Mild hyperemesis gravidarum: Secondary | ICD-10-CM | POA: Insufficient documentation

## 2011-11-22 LAB — DIFFERENTIAL
Eosinophils Absolute: 0.1 10*3/uL (ref 0.0–0.7)
Eosinophils Relative: 1 % (ref 0–5)
Lymphs Abs: 2 10*3/uL (ref 0.7–4.0)
Monocytes Absolute: 0.8 10*3/uL (ref 0.1–1.0)
Monocytes Relative: 10 % (ref 3–12)

## 2011-11-22 LAB — URINALYSIS, ROUTINE W REFLEX MICROSCOPIC
Glucose, UA: NEGATIVE mg/dL
Ketones, ur: 15 mg/dL — AB
Leukocytes, UA: NEGATIVE
pH: 6.5 (ref 5.0–8.0)

## 2011-11-22 LAB — URINE MICROSCOPIC-ADD ON

## 2011-11-22 LAB — WET PREP, GENITAL: Trich, Wet Prep: NONE SEEN

## 2011-11-22 LAB — CBC
HCT: 34.5 % — ABNORMAL LOW (ref 36.0–46.0)
Hemoglobin: 11.6 g/dL — ABNORMAL LOW (ref 12.0–15.0)
MCH: 27.4 pg (ref 26.0–34.0)
MCV: 81.6 fL (ref 78.0–100.0)
RBC: 4.23 MIL/uL (ref 3.87–5.11)

## 2011-11-22 MED ORDER — PROMETHAZINE HCL 25 MG PO TABS: 12.5000 mg | ORAL_TABLET | Freq: Four times a day (QID) | ORAL | Status: AC | PRN

## 2011-11-22 MED ORDER — LACTATED RINGERS IV SOLN
INTRAVENOUS | Status: DC
  Administered 2011-11-22: 11:00:00 via INTRAVENOUS

## 2011-11-22 MED ORDER — ONDANSETRON HCL 4 MG/2ML IJ SOLN
4.0000 mg | Freq: Once | INTRAMUSCULAR | Status: AC
  Administered 2011-11-22: 4 mg via INTRAVENOUS
  Filled 2011-11-22: qty 2

## 2011-11-22 NOTE — MAU Note (Signed)
Pt in c/o nausea x3 weeks, vomiting x4 days, states she is unable to keep anything down.  LMP 10/12/11.  Reports spotting 2 days ago.  Reports normal discharge.

## 2011-11-22 NOTE — MAU Provider Note (Signed)
Attestation of Attending Supervision of Advanced Practitioner: Evaluation and management procedures were performed by the PA/NP/CNM/OB Fellow under my supervision/collaboration. Chart reviewed, and agree with management and plan.  Arham Symmonds, M.D. 11/22/2011 2:43 PM   

## 2011-11-22 NOTE — MAU Note (Signed)
Past 2 wks has been really nauseous. Past 4 days has been throwing up, last all day.  No energy.  Period is late.  Did home test 2 days ago, was positive.

## 2011-11-22 NOTE — Discharge Instructions (Signed)
________________________________________     To schedule your Maternity Eligibility Appointment, please call [phone].  When you arrive for your appointment you must bring the following items or information listed below.  Your appointment will be rescheduled if you do not have these items or are 15 minutes late. If currently receiving Medicaid, you MUST bring: 1. Medicaid Card 2. Social Security Card 3. Picture ID 4. Proof of Pregnancy 5. Verification of current address if the address on Medicaid card is incorrect "postmarked mail" If not receiving Medicaid, you MUST bring: 1. Social Security Card 2. Picture ID 3. Birth Certificate (if available) Passport or *Green Card 4. Proof of Pregnancy 5. Verification of current address "postmarked mail" for each income presented. 6. Verification of insurance coverage, if any 7. Check stubs from each employer for the previous month (if unable to present check stub  for each week, we will accept check stub for the first and last week ill the same month.) If you can't locate check stubs, you must bring a letter from the employer(s) and it must have the following information on letterhead, typed, in English: o name of company o company telephone number o how long been with the company, if less than one month o how much person earns per hour o how many hours per week work o the gross pay the person earned for the previous month If you are 28 years old or less, you do not have to bring proof of income unless you work or live with the father of the baby and at that time we will need proof of income from you and/or the father of the baby. Green Card recipients are eligible for Medicaid for Pregnant Women (MPW)    Morning Sickness Morning sickness is when you feel sick to your stomach (nauseous) during pregnancy. You may feel sick to your stomach and throw up (vomit). You may feel sick in the morning, but you can feel this way any time of day.  Some women feel very sick to their stomach and cannot stop throwing up (hyperemesis gravidarum). HOME CARE  Take multivitamins as told by your doctor. Taking multivitamins before getting pregnant can stop or lessen the harshness of morning sickness.   Eat dry toast or unsalted crackers before getting out of bed.   Eat 5 to 6 small meals a day.   Eat dry and bland foods like rice and baked potatoes.   Do not drink liquids with meals. Drink between meals.   Do not eat greasy, fatty, or spicy foods.   Have someone cook for you if the smell of food causes you to feel sick or throw up.   Do not take vitamins with iron, or as told by your doctor.   Eat protein when you need a snack (nuts, yogurt, cheese).   Eat unsweetened gelatins for dessert.   Wear a bracelet used for sea sickness (acupressure wristband).   Go to a doctor that puts thin needles into certain body points (acupuncture) to improve how you feel.   Do not smoke.   Use a humidifier to keep the air in your house free of odors.  GET HELP RIGHT AWAY IF:   You feel very sick to your stomach and cannot stop throwing up.   You pass out (faint).   You have a fever.   You need medicine to feel better.   You feel dizzy or lightheaded.   You are losing weight.   You need help knowing  what to eat and what not to eat.  MAKE SURE YOU:   Understand these instructions.   Will watch your condition.   Will get help right away if you are not doing well or get worse.  Document Released: 10/04/2004 Document Revised: 08/16/2011 Document Reviewed: 11/24/2009 [facility] Patient Information 2012 [place], [place].

## 2011-11-22 NOTE — [facility] Provider Note (Signed)
History     CSN: [id number]  Arrival date & time 11/22/11  4098   None     Chief Complaint  Patient presents with  . Morning Sickness   HPI Stacey Chen is a 31 y.o. female @ [redacted]w[redacted]d gestation who presents to [facility]U for nausea and vomiting in early pregnancy. Nausea started about 3 weeks ago and vomiting about 3 days ago. Noted spotting 3 days ago.  Feeling tired with breast tenderness. Last pap smear 2 years ago and was normal. The history was provided by the patient.  Past Medical History  Diagnosis Date  . Asthma     mild, intermittent  . Anxiety   . Obesity     Past Surgical History  Procedure Date  . No past surgeries     Family History  Problem Relation Age of Onset  . Depression Mother   . Alcohol abuse Mother   . Drug abuse Mother   . Drug abuse Father   . Alcohol abuse Maternal Grandmother   . Drug abuse Maternal Grandmother   . Diabetes Maternal Grandmother   . Hypertension Maternal Grandmother   . Alcohol abuse Maternal Grandfather   . Drug abuse Maternal Grandfather   . Hypertension Maternal Grandfather   . Alcohol abuse Paternal Grandmother   . Drug abuse Paternal Grandmother   . Alcohol abuse Paternal Grandfather   . Drug abuse Paternal Grandfather     History  Substance Use Topics  . Smoking status: Former Games developer  . Smokeless tobacco: Never Used  . Alcohol Use: Yes     occasional- very rarely- quit with pregnancy    OB History    Grav Para Term Preterm Abortions TAB SAB Ect Mult Living   1 0 0 0 0 0 0 0 0 0       Review of Systems  Constitutional: Positive for appetite change and fatigue. Negative for fever, chills and diaphoresis.  HENT: Positive for sore throat. Negative for ear pain, congestion, facial swelling, neck pain, neck stiffness, dental problem and sinus pressure.   Eyes: Negative for photophobia, pain and discharge.  Respiratory: Positive for cough. Negative for chest tightness and wheezing.   Gastrointestinal: Positive for  nausea, vomiting and constipation. Negative for abdominal pain, diarrhea and abdominal distention.  Genitourinary: Positive for frequency, vaginal bleeding (spotting) and vaginal discharge. Negative for dysuria, flank pain and difficulty urinating.  Musculoskeletal: Positive for back pain. Negative for myalgias and gait problem.  Skin: Negative for color change and rash.  Neurological: Positive for light-headedness and headaches. Negative for dizziness, speech difficulty, weakness and numbness.  Psychiatric/Behavioral: Negative for confusion and agitation. The patient is not nervous/anxious.     Allergies  Review of patient's allergies indicates no known allergies.  Home Medications  No current outpatient prescriptions on file.  BP 120/67  Pulse 77  Temp(Src) 97.9 F (36.6 C) (Oral)  Resp 20  Ht 5\' 4"  (1.626 m)  Wt 227 lb (102.967 kg)  BMI 38.96 kg/m2  SpO2 100%  LMP 10/12/2011  Physical Exam  Nursing note and vitals reviewed. Constitutional: She is oriented to person, place, and time.       obese  HENT:  Head: Normocephalic.  Eyes: EOM are normal.  Neck: Neck supple.  Cardiovascular: Normal rate.   Pulmonary/Chest: Effort normal.  Abdominal: Soft. There is no tenderness.       obese  Genitourinary:       External genitalia without lesions. White discharge vaginal vault. Cervix long, closed, no  CMT. No adnexal tenderness. Uterus, unable to palpate due to patient habitus.  Musculoskeletal: Normal range of motion.  Neurological: She is alert and oriented to person, place, and time. No cranial nerve deficit.  Skin: Skin is warm and dry.  Psychiatric: She has a normal mood and affect. Her behavior is normal. Judgment and thought content normal.   Results for orders placed during the hospital encounter of 11/22/11 (from the past 24 hour(s))  URINALYSIS, ROUTINE W REFLEX MICROSCOPIC     Status: Abnormal   Collection Time   11/22/11  9:45 AM      Component Value Range    Color, Urine YELLOW  YELLOW    APPearance CLEAR  CLEAR    Specific Gravity, Urine 1.010  1.005 - 1.030    pH 6.5  5.0 - 8.0    Glucose, UA NEGATIVE  NEGATIVE (mg/dL)   Hgb urine dipstick TRACE (*) NEGATIVE    Bilirubin Urine NEGATIVE  NEGATIVE    Ketones, ur 15 (*) NEGATIVE (mg/dL)   Protein, ur NEGATIVE  NEGATIVE (mg/dL)   Urobilinogen, UA 0.2  0.0 - 1.0 (mg/dL)   Nitrite NEGATIVE  NEGATIVE    Leukocytes, UA NEGATIVE  NEGATIVE   URINE MICROSCOPIC-ADD ON     Status: Abnormal   Collection Time   11/22/11  9:45 AM      Component Value Range   Squamous Epithelial / LPF FEW (*) RARE    WBC, UA 0-2  <3 (WBC/hpf)  POCT PREGNANCY, URINE     Status: Abnormal   Collection Time   11/22/11  9:51 AM      Component Value Range   Preg Test, Ur POSITIVE (*) NEGATIVE   CBC     Status: Abnormal   Collection Time   11/22/11 10:52 AM      Component Value Range   WBC 8.4  4.0 - 10.5 (K/uL)   RBC 4.23  3.87 - 5.11 (MIL/uL)   Hemoglobin 11.6 (*) 12.0 - 15.0 (g/dL)   HCT 16.1 (*) 09.6 - 46.0 (%)   MCV 81.6  78.0 - 100.0 (fL)   MCH 27.4  26.0 - 34.0 (pg)   MCHC 33.6  30.0 - 36.0 (g/dL)   RDW 04.5  40.9 - 81.1 (%)   Platelets 380  150 - 400 (K/uL)  DIFFERENTIAL     Status: Normal   Collection Time   11/22/11 10:52 AM      Component Value Range   Neutrophils Relative 65  43 - 77 (%)   Neutro Abs 5.5  1.7 - 7.7 (K/uL)   Lymphocytes Relative 24  12 - 46 (%)   Lymphs Abs 2.0  0.7 - 4.0 (K/uL)   Monocytes Relative 10  3 - 12 (%)   Monocytes Absolute 0.8  0.1 - 1.0 (K/uL)   Eosinophils Relative 1  0 - 5 (%)   Eosinophils Absolute 0.1  0.0 - 0.7 (K/uL)   Basophils Relative 1  0 - 1 (%)   Basophils Absolute 0.1  0.0 - 0.1 (K/uL)  HCG, QUANTITATIVE, PREGNANCY     Status: Abnormal   Collection Time   11/22/11 10:52 AM      Component Value Range   hCG, Beta Chain, Quant, S 91478 (*) <5 (mIU/mL)  ABO/RH     Status: Normal   Collection Time   11/22/11 10:52 AM      Component Value Range   ABO/RH(D) O  POS    WET PREP, GENITAL  Status: Abnormal   Collection Time   11/22/11 12:26 PM      Component Value Range   Yeast Wet Prep HPF POC NONE SEEN  NONE SEEN    Trich, Wet Prep NONE SEEN  NONE SEEN    Clue Cells Wet Prep HPF POC FEW (*) NONE SEEN    WBC, Wet Prep HPF POC NONE SEEN  NONE SEEN    Ultrasound shows a 5 week 5 day IUP.  Assessment: Nausea and vomiting in early pregnancy  Plan:  IV hydration   Zofran 4 mg IV  Reevaluation:  Patient feeling much better after fluids and Zofran. Will d/c home with Rx for Phenergan.    Patient to start prenatal care.  ED Course  Procedures   MDM

## 2011-11-23 LAB — GC/CHLAMYDIA PROBE AMP, GENITAL: GC Probe Amp, Genital: NEGATIVE

## 2014-07-12 ENCOUNTER — Encounter (HOSPITAL_COMMUNITY): Payer: Self-pay | Admitting: *Deleted

## 2019-05-25 ENCOUNTER — Encounter: Payer: Self-pay | Admitting: Family Medicine

## 2019-05-25 ENCOUNTER — Other Ambulatory Visit: Payer: Self-pay

## 2019-05-25 ENCOUNTER — Ambulatory Visit (INDEPENDENT_AMBULATORY_CARE_PROVIDER_SITE_OTHER): Payer: 59 | Admitting: Family Medicine

## 2019-05-25 VITALS — BP 140/92 | HR 85 | Temp 97.1°F | Resp 18 | Ht 63.5 in | Wt 259.4 lb

## 2019-05-25 DIAGNOSIS — E559 Vitamin D deficiency, unspecified: Secondary | ICD-10-CM | POA: Diagnosis not present

## 2019-05-25 DIAGNOSIS — Z30432 Encounter for removal of intrauterine contraceptive device: Secondary | ICD-10-CM

## 2019-05-25 DIAGNOSIS — R5383 Other fatigue: Secondary | ICD-10-CM | POA: Diagnosis not present

## 2019-05-25 DIAGNOSIS — R110 Nausea: Secondary | ICD-10-CM | POA: Diagnosis not present

## 2019-05-25 NOTE — Progress Notes (Signed)
Subjective:    Patient ID: Stacey Chen, female    DOB: 05-Aug-1981, 38 y.o.   MRN: [id number]  HPI   Patient presents to clinic to establish PCP.  [place] [facility] [place]  Only medication patient has currently is an IUD.  Patient has been having some nausea off and on the past few weeks and some fatigue, wondering if she possibly is pregnant.  Took a home pregnancy test x2, both were negative.  Past medical, surgical, social and family history reviewed and updated accordingly in chart.  Last Pap smear was performed in 2018.    TDAP uptodate  Declines flu vaccine  Past Medical History:  Diagnosis Date  . Anxiety   . Asthma    mild, intermittent  . Obesity    Past Surgical History:  Procedure Laterality Date  . NO PAST SURGERIES     Family History  Problem Relation Age of Onset  . Depression Mother   . Alcohol abuse Mother   . Drug abuse Mother   . Drug abuse Father   . Alcohol abuse Maternal Grandmother   . Drug abuse Maternal Grandmother   . Diabetes Maternal Grandmother   . Hypertension Maternal Grandmother   . Alcohol abuse Maternal Grandfather   . Drug abuse Maternal Grandfather   . Hypertension Maternal Grandfather   . Alcohol abuse Paternal Grandmother   . Drug abuse Paternal Grandmother   . Alcohol abuse Paternal Grandfather   . Drug abuse Paternal Grandfather    Social History   Tobacco Use  . Smoking status: Former Games developermoker  . Smokeless tobacco: Never Used  Substance Use Topics  . Alcohol use: Never    Frequency: Never    Comment: occasional- very rarely- quit with pregnancy     Review of Systems  Constitutional: Negative for chills, and fever. +fatigue HENT: Negative for congestion, ear pain, sinus pain and sore throat.   Eyes: Negative.   Respiratory: Negative for cough, shortness of breath and wheezing.   Cardiovascular: Negative for chest pain, palpitations and leg swelling.  Gastrointestinal: Negative for  abdominal pain, diarrhea. +nausea and vomiting.  Genitourinary: Negative for dysuria, frequency and urgency.  Musculoskeletal: Negative for arthralgias and myalgias.  Skin: Negative for color change, pallor and rash.  Neurological: Negative for syncope, light-headedness and headaches.  Psychiatric/Behavioral: The patient is not nervous/anxious.       Objective:   Physical Exam Vitals signs and nursing note reviewed.  Constitutional:      General: She is not in acute distress.    Appearance: She is not ill-appearing, toxic-appearing or diaphoretic.  HENT:     Head: Normocephalic and atraumatic.  Eyes:     General: No scleral icterus.    Extraocular Movements: Extraocular movements intact.     Conjunctiva/sclera: Conjunctivae normal.     Pupils: Pupils are equal, round, and reactive to light.  Neck:     Musculoskeletal: Normal range of motion and neck supple. No neck rigidity.     Thyroid: No thyromegaly or thyroid tenderness.  Cardiovascular:     Rate and Rhythm: Normal rate and regular rhythm.     Heart sounds: Normal heart sounds.  Pulmonary:     Effort: Pulmonary effort is normal. No respiratory distress.     Breath sounds: Normal breath sounds.  Abdominal:     General: Bowel sounds are normal. There is no distension.     Palpations: Abdomen is soft. There is no mass.  Tenderness: There is no abdominal tenderness. There is no right CVA tenderness, left CVA tenderness, guarding or rebound.     Comments: Obese ABD  Musculoskeletal:     Right lower leg: No edema.     Left lower leg: No edema.  Skin:    General: Skin is warm and dry.     Coloration: Skin is not jaundiced or pale.  Neurological:     General: No focal deficit present.     Mental Status: She is alert and oriented to person, place, and time.     Gait: Gait normal.  Psychiatric:        Mood and Affect: Mood normal.        Behavior: Behavior normal.        Thought Content: Thought content normal.         Judgment: Judgment normal.    Today's Vitals   05/25/19 1519  BP: (!) 140/92  Pulse: 85  Resp: 18  Temp: (!) 97.1 F (36.2 C)  TempSrc: Temporal  SpO2: 98%  Weight: 259 lb 6.4 oz (117.7 kg)  Height: 5' 3.5" (1.613 m)   Body mass index is 45.23 kg/m.     Assessment & Plan:    Nausea/vomiting, fatigue-unclear reason for the symptoms.  Potentially could be related to pregnancy.  We will get hCG and blood work to confirm it due to patient having 2- urine pregnancies recently at home.  Also discussed with patient that her symptoms could be related to excess acid production in the stomach, possible gallbladder issues, diet choices with eating and drinking.  IUD removal-patient would like IUD taken out.  We do not have tools to remove IUD in clinic today, we will refer her to GYN to get this removed and to discuss further birth control options.  Patient aware someone will call her with lab results when they are available.  Advised that if pregnancy is negative, I would recommend trialing some sort of antacid medicine taken once daily to see if this helps reduce nausea symptoms.  Hopefully the labs will also reveal causes of fatigue if not related to being pregnant.

## 2019-05-26 ENCOUNTER — Telehealth: Payer: Self-pay | Admitting: *Deleted

## 2019-05-26 LAB — CBC WITH DIFFERENTIAL/PLATELET
Basophils Absolute: 0.1 10*3/uL (ref 0.0–0.1)
Basophils Relative: 0.6 % (ref 0.0–3.0)
Eosinophils Absolute: 0.2 10*3/uL (ref 0.0–0.7)
Eosinophils Relative: 2.1 % (ref 0.0–5.0)
HCT: 36.5 % (ref 36.0–46.0)
Hemoglobin: 12.3 g/dL (ref 12.0–15.0)
Lymphocytes Relative: 15.2 % (ref 12.0–46.0)
Lymphs Abs: 1.5 10*3/uL (ref 0.7–4.0)
MCHC: 33.8 g/dL (ref 30.0–36.0)
MCV: 86.2 fl (ref 78.0–100.0)
Monocytes Absolute: 0.6 10*3/uL (ref 0.1–1.0)
Monocytes Relative: 6.4 % (ref 3.0–12.0)
Neutro Abs: 7.6 10*3/uL (ref 1.4–7.7)
Neutrophils Relative %: 75.7 % (ref 43.0–77.0)
Platelets: 357 10*3/uL (ref 150.0–400.0)
RBC: 4.23 Mil/uL (ref 3.87–5.11)
RDW: 14.9 % (ref 11.5–15.5)
WBC: 10 10*3/uL (ref 4.0–10.5)

## 2019-05-26 LAB — COMPREHENSIVE METABOLIC PANEL
ALT: 11 U/L (ref 0–35)
AST: 13 U/L (ref 0–37)
Albumin: 4 g/dL (ref 3.5–5.2)
Alkaline Phosphatase: 101 U/L (ref 39–117)
BUN: 12 mg/dL (ref 6–23)
CO2: 25 mEq/L (ref 19–32)
Calcium: 9.5 mg/dL (ref 8.4–10.5)
Chloride: 108 mEq/L (ref 96–112)
Creatinine, Ser: 0.88 mg/dL (ref 0.40–1.20)
GFR: 88.08 mL/min (ref >60.00)
Glucose, Bld: 80 mg/dL (ref 70–99)
Potassium: 4.1 mEq/L (ref 3.5–5.1)
Sodium: 139 mEq/L (ref 135–145)
Total Bilirubin: 0.3 mg/dL (ref 0.2–1.2)
Total Protein: 6.6 g/dL (ref 6.0–8.3)

## 2019-05-26 LAB — B12 AND FOLATE PANEL
Folate: 4.6 ng/mL — ABNORMAL LOW (ref >5.9)
Vitamin B-12: 410 pg/mL (ref 211–911)

## 2019-05-26 LAB — THYROID PANEL WITH TSH
Free Thyroxine Index: 1.8 (ref 1.4–3.8)
T3 Uptake: 27 % (ref 22–35)
T4, Total: 6.7 ug/dL (ref 5.1–11.9)
TSH: 2.77 mIU/L

## 2019-05-26 LAB — HCG, QUANTITATIVE, PREGNANCY: Quantitative HCG: <0.6 m[IU]/mL

## 2019-05-26 LAB — VITAMIN D 25 HYDROXY (VIT D DEFICIENCY, FRACTURES): VITD: 10.08 ng/mL — ABNORMAL LOW (ref 30.00–100.00)

## 2019-05-26 MED ORDER — VITAMIN D (ERGOCALCIFEROL) 1.25 MG (50000 UNIT) PO CAPS: 50000.0000 [IU] | ORAL_CAPSULE | ORAL | 0 refills | Status: AC

## 2019-05-26 MED ORDER — OMEPRAZOLE 20 MG PO CPDR: 20.0000 mg | DELAYED_RELEASE_CAPSULE | Freq: Every day | ORAL | 3 refills | Status: AC

## 2019-05-26 NOTE — Addendum Note (Signed)
Addended by: Philis Nettle on: 05/26/2019 04:37 PM   Modules accepted: Orders

## 2019-05-26 NOTE — Telephone Encounter (Signed)
Copied from [place] [id number]. Topic: General - Other >> May 26, 2019  1:53 PM Keene Breath wrote: Reason for CRM: Patient stated that she was supposed to get a call back today regarding some test results.  Patient still has not heard from the nurse or doctor.  Please call patient when results are in.  CB# [phone]

## 2019-05-26 NOTE — Addendum Note (Signed)
Addended by: Philis Nettle on: 05/26/2019 12:19 PM   Modules accepted: Orders

## 2019-05-26 NOTE — Telephone Encounter (Signed)
Called Pt back with results. Noted under Results notes

## 2019-06-09 ENCOUNTER — Other Ambulatory Visit: Payer: Self-pay

## 2019-06-09 ENCOUNTER — Encounter: Payer: Self-pay | Admitting: Intensive Care

## 2019-06-09 ENCOUNTER — Emergency Department: Payer: 59

## 2019-06-09 ENCOUNTER — Emergency Department
Admission: EM | Admit: 2019-06-09 | Discharge: 2019-06-09 | Disposition: A | Discharge status: Home / Self Care | Payer: 59 | Attending: Student in an Organized Health Care Education/Training Program | Admitting: Student in an Organized Health Care Education/Training Program

## 2019-06-09 DIAGNOSIS — Z79899 Other long term (current) drug therapy: Secondary | ICD-10-CM | POA: Insufficient documentation

## 2019-06-09 DIAGNOSIS — J45909 Unspecified asthma, uncomplicated: Secondary | ICD-10-CM | POA: Insufficient documentation

## 2019-06-09 DIAGNOSIS — Z87891 Personal history of nicotine dependence: Secondary | ICD-10-CM | POA: Diagnosis not present

## 2019-06-09 DIAGNOSIS — R1032 Left lower quadrant pain: Secondary | ICD-10-CM

## 2019-06-09 LAB — COMPREHENSIVE METABOLIC PANEL
ALT: 14 U/L (ref 0–44)
AST: 16 U/L (ref 15–41)
Albumin: 4.2 g/dL (ref 3.5–5.0)
Alkaline Phosphatase: 98 U/L (ref 38–126)
Anion gap: 8 (ref 5–15)
BUN: 10 mg/dL (ref 6–20)
CO2: 22 mmol/L (ref 22–32)
Calcium: 9.1 mg/dL (ref 8.9–10.3)
Chloride: 109 mmol/L (ref 98–111)
Creatinine, Ser: 0.8 mg/dL (ref 0.44–1.00)
GFR calc Af Amer: >60 mL/min (ref >60)
GFR calc non Af Amer: >60 mL/min (ref >60)
Glucose, Bld: 91 mg/dL (ref 70–99)
Potassium: 4 mmol/L (ref 3.5–5.1)
Sodium: 139 mmol/L (ref 135–145)
Total Bilirubin: 0.5 mg/dL (ref 0.3–1.2)
Total Protein: 7.7 g/dL (ref 6.5–8.1)

## 2019-06-09 LAB — URINALYSIS, COMPLETE (UACMP) WITH MICROSCOPIC
Bacteria, UA: NONE SEEN
Bilirubin Urine: NEGATIVE
Glucose, UA: NEGATIVE mg/dL
Ketones, ur: NEGATIVE mg/dL
Nitrite: NEGATIVE
Protein, ur: NEGATIVE mg/dL
Specific Gravity, Urine: 1.012 (ref 1.005–1.030)
pH: 6 (ref 5.0–8.0)

## 2019-06-09 LAB — CBC
HCT: 38.1 % (ref 36.0–46.0)
Hemoglobin: 12.5 g/dL (ref 12.0–15.0)
MCH: 27.8 pg (ref 26.0–34.0)
MCHC: 32.8 g/dL (ref 30.0–36.0)
MCV: 84.7 fL (ref 80.0–100.0)
Platelets: 367 10*3/uL (ref 150–400)
RBC: 4.5 MIL/uL (ref 3.87–5.11)
RDW: 14.2 % (ref 11.5–15.5)
WBC: 10.9 10*3/uL — ABNORMAL HIGH (ref 4.0–10.5)
nRBC: 0 % (ref 0.0–0.2)

## 2019-06-09 LAB — LIPASE, BLOOD: Lipase: 25 U/L (ref 11–51)

## 2019-06-09 LAB — POCT PREGNANCY, URINE: Preg Test, Ur: NEGATIVE

## 2019-06-09 MED ORDER — IBUPROFEN 400 MG PO TABS
400.0000 mg | ORAL_TABLET | Freq: Once | ORAL | Status: AC | PRN
  Administered 2019-06-09: 13:00:00 400 mg via ORAL
  Filled 2019-06-09: qty 1

## 2019-06-09 MED ORDER — HYDROCODONE-ACETAMINOPHEN 5-325 MG PO TABS: 1.0000 | ORAL_TABLET | ORAL | 0 refills | Status: AC | PRN

## 2019-06-09 MED ORDER — HYDROCODONE-ACETAMINOPHEN 5-325 MG PO TABS
1.0000 | ORAL_TABLET | Freq: Once | ORAL | Status: AC
  Administered 2019-06-09: 1 via ORAL
  Filled 2019-06-09: qty 1

## 2019-06-09 NOTE — ED Provider Notes (Signed)
[facility] Emergency Department Provider Note    First MD Initiated Contact with Patient 06/09/19 1600     (approximate)  I have reviewed the triage vital signs and the nursing notes.   HISTORY  Chief Complaint Flank Pain (left)    HPI Stacey Chen is a 38 y.o. female the below listed past medical history presents the ER for evaluation of left-sided abdominal pain crampy in nature.  Also complaining of severe discomfort whenever she has intercourse.  This is not new.  Is been going on for quite some time.  She is concerned that the IUD is misplaced as it was supposed to be removed over a year ago.  Denies any vaginal discharge or bleeding.  Denies any fevers.  No recent trauma.    Past Medical History:  Diagnosis Date   Anxiety    Asthma    mild, intermittent   Obesity    Family History  Problem Relation Age of Onset   Depression Mother    Alcohol abuse Mother    Drug abuse Mother    Drug abuse Father    Alcohol abuse Maternal Grandmother    Drug abuse Maternal Grandmother    Diabetes Maternal Grandmother    Hypertension Maternal Grandmother    Alcohol abuse Maternal Grandfather    Drug abuse Maternal Grandfather    Hypertension Maternal Grandfather    Alcohol abuse Paternal Grandmother    Drug abuse Paternal Grandmother    Alcohol abuse Paternal Grandfather    Drug abuse Paternal Grandfather    Past Surgical History:  Procedure Laterality Date   NO PAST SURGERIES     Patient Active Problem List   Diagnosis Date Noted   Right knee injury 02/23/2011   SINUSITIS 11/07/2010   ACUTE BRONCHITIS 07/21/2010   LYMPHADENOPATHY 04/14/2010   DEPRESSION/ANXIETY 12/28/2009   OBESITY, UNSPECIFIED 01/25/2009   DEPRESSION 01/25/2009   ASTHMA 01/25/2009      Prior to Admission medications   Medication Sig Start Date End Date Taking? Authorizing Provider  HYDROcodone-acetaminophen (NORCO) 5-325 MG tablet Take 1  tablet by mouth every 4 (four) hours as needed for moderate pain. 06/09/19   Willy Eddy, MD  omeprazole (PRILOSEC) 20 MG capsule Take 1 capsule (20 mg total) by mouth daily. 05/26/19   Tracey Harries, FNP  Vitamin D, Ergocalciferol, (DRISDOL) 1.25 MG (50000 UT) CAPS capsule Take 1 capsule (50,000 Units total) by mouth every 7 (seven) days. 05/26/19   Tracey Harries, FNP    Allergies Patient has no known allergies.    Social History Social History   Tobacco Use   Smoking status: Former Smoker   Smokeless tobacco: Never Used  Substance Use Topics   Alcohol use: Yes    Frequency: Never    Comment: occasional- very rarely-    Drug use: Yes    Types: Marijuana    Comment: smokes THC- quit pregnancy     Review of Systems Patient denies headaches, rhinorrhea, blurry vision, numbness, shortness of breath, chest pain, edema, cough, abdominal pain, nausea, vomiting, diarrhea, dysuria, fevers, rashes or hallucinations unless otherwise stated above in HPI. ____________________________________________   PHYSICAL EXAM:  VITAL SIGNS: Vitals:   06/09/19 1235  BP: (!) 136/103  Pulse: 82  Resp: 18  Temp: 98.2 F (36.8 C)  SpO2: 97%    Constitutional: Alert and oriented.  Eyes: Conjunctivae are normal.  Head: Atraumatic. Nose: No congestion/rhinnorhea. Mouth/Throat: Mucous membranes are moist.   Neck: No stridor. Painless ROM.  Cardiovascular: Normal rate, regular rhythm. Grossly normal heart sounds.  Good peripheral circulation. Respiratory: Normal respiratory effort.  No retractions. Lungs CTAB. Gastrointestinal: Soft and nontender. No distention. No abdominal bruits. No CVA tenderness. Genitourinary: deferred Musculoskeletal: No lower extremity tenderness nor edema.  No joint effusions. Neurologic:  Normal speech and language. No gross focal neurologic deficits are appreciated. No facial droop Skin:  Skin is warm, dry and intact. No rash noted. Psychiatric: Mood and  affect are normal. Speech and behavior are normal.  ____________________________________________   LABS (all labs ordered are listed, but only abnormal results are displayed)  Results for orders placed or performed during the hospital encounter of 06/09/19 (from the past 24 hour(s))  Lipase, blood     Status: None   Collection Time: 06/09/19 12:44 PM  Result Value Ref Range   Lipase 25 11 - 51 U/L  Comprehensive metabolic panel     Status: None   Collection Time: 06/09/19 12:44 PM  Result Value Ref Range   Sodium 139 135 - 145 mmol/L   Potassium 4.0 3.5 - 5.1 mmol/L   Chloride 109 98 - 111 mmol/L   CO2 22 22 - 32 mmol/L   Glucose, Bld 91 70 - 99 mg/dL   BUN 10 6 - 20 mg/dL   Creatinine, Ser 0.80 0.44 - 1.00 mg/dL   Calcium 9.1 8.9 - 10.3 mg/dL   Total Protein 7.7 6.5 - 8.1 g/dL   Albumin 4.2 3.5 - 5.0 g/dL   AST 16 15 - 41 U/L   ALT 14 0 - 44 U/L   Alkaline Phosphatase 98 38 - 126 U/L   Total Bilirubin 0.5 0.3 - 1.2 mg/dL   GFR calc non Af Amer >60 >60 mL/min   GFR calc Af Amer >60 >60 mL/min   Anion gap 8 5 - 15  CBC     Status: Abnormal   Collection Time: 06/09/19 12:44 PM  Result Value Ref Range   WBC 10.9 (H) 4.0 - 10.5 K/uL   RBC 4.50 3.87 - 5.11 MIL/uL   Hemoglobin 12.5 12.0 - 15.0 g/dL   HCT 38.1 36.0 - 46.0 %   MCV 84.7 80.0 - 100.0 fL   MCH 27.8 26.0 - 34.0 pg   MCHC 32.8 30.0 - 36.0 g/dL   RDW 14.2 11.5 - 15.5 %   Platelets 367 150 - 400 K/uL   nRBC 0.0 0.0 - 0.2 %  Urinalysis, Complete w Microscopic     Status: Abnormal   Collection Time: 06/09/19 12:44 PM  Result Value Ref Range   Color, Urine YELLOW (A) YELLOW   APPearance CLOUDY (A) CLEAR   Specific Gravity, Urine 1.012 1.005 - 1.030   pH 6.0 5.0 - 8.0   Glucose, UA NEGATIVE NEGATIVE mg/dL   Hgb urine dipstick SMALL (A) NEGATIVE   Bilirubin Urine NEGATIVE NEGATIVE   Ketones, ur NEGATIVE NEGATIVE mg/dL   Protein, ur NEGATIVE NEGATIVE mg/dL   Nitrite NEGATIVE NEGATIVE   Leukocytes,Ua MODERATE (A)  NEGATIVE   RBC / HPF 0-5 0 - 5 RBC/hpf   WBC, UA 6-10 0 - 5 WBC/hpf   Bacteria, UA NONE SEEN NONE SEEN   Squamous Epithelial / LPF 11-20 0 - 5  Pregnancy, urine POC     Status: None   Collection Time: 06/09/19 12:54 PM  Result Value Ref Range   Preg Test, Ur NEGATIVE NEGATIVE   ____________________________________________ ____________________________________________  RADIOLOGY  I personally reviewed all radiographic images ordered to evaluate for the above  acute complaints and reviewed radiology reports and findings.  These findings were personally discussed with the patient.  Please see medical record for radiology report.  ____________________________________________   PROCEDURES  Procedure(s) performed:  Procedures    Critical Care performed: no ____________________________________________   INITIAL IMPRESSION / ASSESSMENT AND PLAN / ED COURSE  Pertinent labs & imaging results that were available during my care of the patient were reviewed by me and considered in my medical decision making (see chart for details).   DDX: Stone, hernia, musculoskeletal strain, colitis, shingles, cyst, mass  Stacey Chen is a 38 y.o. who presents to the ED with symptoms as described above.  Blood work is fairly reassuring.  Borderline elevated white count.  Does not seem consistent with acute myositis.  Urinalysis has no bacteria many squamous cells.  Lower suspicion for cystitis.  The patient will be placed on continuous pulse oximetry and telemetry for monitoring.  Laboratory evaluation will be sent to evaluate for the above complaints.     Clinical Course as of Jun 09 1707  Tue Jun 09, 2019  1704 Patient reassessed.  CT imaging reassuring.  No explanation for her pain.  Likely musculoskeletal pain.  I do not observe any evidence of shingles.  On palpating hernia no overlying cellulitis.  Discussed option for pelvic exam to further evaluate but patient describing symptoms of vaginal  spasm and severe discomfort with any sort of vaginal penetration and declining that evaluation.  States that she prefer to follow-up with OB/GYN which I think is reasonable.  Have discussed with the patient and available family all diagnostics and treatments performed thus far and all questions were answered to the best of my ability. The patient demonstrates understanding and agreement with plan.    [PR]    Clinical Course User Index [PR] Willy Eddyobinson, Fielding Mault, MD    The patient was evaluated in Emergency Department today for the symptoms described in the history of present illness. He/she was evaluated in the context of the global COVID-19 pandemic, which necessitated consideration that the patient might be at risk for infection with the SARS-CoV-2 virus that causes COVID-19. Institutional protocols and algorithms that pertain to the evaluation of patients at risk for COVID-19 are in a state of rapid change based on information released by regulatory bodies including the CDC and federal and state organizations. These policies and algorithms were followed during the patient's care in the ED.  As part of my medical decision making, I reviewed the following data within the electronic MEDICAL RECORD NUMBER Nursing notes reviewed and incorporated, Labs reviewed, notes from prior ED visits and [place] Controlled Substance Database   ____________________________________________   FINAL CLINICAL IMPRESSION(S) / ED DIAGNOSES  Final diagnoses:  Left lower quadrant abdominal pain      NEW MEDICATIONS STARTED DURING THIS VISIT:  New Prescriptions   HYDROCODONE-ACETAMINOPHEN (NORCO) 5-325 MG TABLET    Take 1 tablet by mouth every 4 (four) hours as needed for moderate pain.     Note:  This document was prepared using Dragon voice recognition software and may include unintentional dictation errors.    Willy Eddyobinson, Breely Panik, MD 06/09/19 [phone]

## 2019-06-09 NOTE — ED Triage Notes (Signed)
Patient c/o left sided flank pain X2 weeks. C/o frequent urination. Reports she was suppose to have her IUD removed back in March 2020 but has not been removed yet. Denies HX kidney stones

## 2019-06-09 NOTE — ED Notes (Signed)
Pt called for ride due to pain medication taken.

## 2019-06-09 NOTE — Discharge Instructions (Signed)

## 2019-07-01 ENCOUNTER — Encounter: Payer: Self-pay | Admitting: Family Medicine

## 2019-07-01 ENCOUNTER — Ambulatory Visit: Payer: 59 | Admitting: Family Medicine

## 2019-07-01 NOTE — Progress Notes (Signed)
Patient did not keep appointment today. She may call to reschedule.  

## 2019-08-11 ENCOUNTER — Ambulatory Visit: Payer: 59 | Admitting: Family Medicine

## 2019-08-21 ENCOUNTER — Ambulatory Visit: Payer: 59 | Admitting: Family

## 2019-12-19 ENCOUNTER — Emergency Department
Admission: EM | Admit: 2019-12-19 | Discharge: 2019-12-19 | Disposition: A | Discharge status: Home / Self Care | Payer: Self-pay | Attending: Emergency Medicine | Admitting: Emergency Medicine

## 2019-12-19 ENCOUNTER — Emergency Department: Payer: Self-pay

## 2019-12-19 ENCOUNTER — Other Ambulatory Visit: Payer: Self-pay

## 2019-12-19 DIAGNOSIS — L0291 Cutaneous abscess, unspecified: Secondary | ICD-10-CM

## 2019-12-19 DIAGNOSIS — L02214 Cutaneous abscess of groin: Secondary | ICD-10-CM | POA: Insufficient documentation

## 2019-12-19 DIAGNOSIS — Z87891 Personal history of nicotine dependence: Secondary | ICD-10-CM | POA: Insufficient documentation

## 2019-12-19 DIAGNOSIS — Z79899 Other long term (current) drug therapy: Secondary | ICD-10-CM | POA: Insufficient documentation

## 2019-12-19 MED ORDER — SULFAMETHOXAZOLE-TRIMETHOPRIM 800-160 MG PO TABS: 2.0000 | ORAL_TABLET | Freq: Two times a day (BID) | ORAL | 0 refills | Status: AC

## 2019-12-19 MED ORDER — SULFAMETHOXAZOLE-TRIMETHOPRIM 800-160 MG PO TABS
2.0000 | ORAL_TABLET | ORAL | Status: AC
  Administered 2019-12-19: 08:00:00 2 via ORAL
  Filled 2019-12-19: qty 2

## 2019-12-19 MED ORDER — OXYCODONE-ACETAMINOPHEN 5-325 MG PO TABS
2.0000 | ORAL_TABLET | Freq: Once | ORAL | Status: AC
  Administered 2019-12-19: 2 via ORAL
  Filled 2019-12-19: qty 2

## 2019-12-19 MED ORDER — OXYCODONE-ACETAMINOPHEN 5-325 MG PO TABS: 2.0000 | ORAL_TABLET | Freq: Three times a day (TID) | ORAL | 0 refills | Status: AC | PRN

## 2019-12-19 NOTE — ED Triage Notes (Signed)
Patient with abscess on labia.  Reports symptoms for approximately a week, but getting worse.

## 2019-12-19 NOTE — Discharge Instructions (Signed)
As we discussed, although you do have a developing infection in your right groin, the ultrasound does not look as if there is anything to be drained at this time.  Please take the full course of antibiotics as prescribed.  If you develop new or worsening symptoms that concern you or the area continues to increase in size in spite of antibiotics after a few days, please follow-up with Dr. Everlene Farrier or one of his colleagues in the general surgery clinic or return to the emergency department.

## 2019-12-19 NOTE — ED Notes (Signed)
Pt arrived to room in a wheelchair.  She states she has several "boils" on her labia that started about a week ago.  She states they appear after she shaves, and this happened about 9 months ago as well.  At that time, a warm compress caused it to burst, but that is not working this time.  Pain at this time while lying down is 7/10.  Pain increases when she touches them or when she walks.

## 2019-12-19 NOTE — ED Notes (Signed)
US at bedside

## 2019-12-19 NOTE — ED Notes (Signed)
NAD noted at time of D/C. Pt denies questions or concerns. Pt ambulatory to the lobby at this time. Pt refused wheelchair to the lobby. Pt states her ride is waiting for her outside upon discharge.

## 2019-12-19 NOTE — ED Provider Notes (Signed)
[facility] Emergency Department Provider Note  ____________________________________________   First MD Initiated Contact with Patient 12/19/19 [phone]     (approximate)  I have reviewed the triage vital signs and the nursing notes.   HISTORY  Chief Complaint Abscess    HPI Stacey Chen is a 39 y.o. female who presents for evaluation of a gradually worsening swollen and painful area in her right groin.  It has been slowly getting worse for about a week.  She said that she had a similar area in her groin in the past after she shaved, so she decided to stop shaving that area, but she did so again recently and then started developing this painful area.  It is right in the crease between her right upper thigh and the mons pubis, not on her labia.  She reports it is exquisitely painful and worse with movement or pushing on the area.  She has no dysuria, denies fever, nausea, vomiting, and has no redness or tenderness that extends out from the specific area in her right groin.  Nothing in particular makes it better and she has been trying to use warm compresses but it is not helping.        Past Medical History:  Diagnosis Date  . Anxiety   . Asthma    mild, intermittent  . Obesity     Patient Active Problem List   Diagnosis Date Noted  . Right knee injury 02/23/2011  . SINUSITIS 11/07/2010  . ACUTE BRONCHITIS 07/21/2010  . LYMPHADENOPATHY 04/14/2010  . DEPRESSION/ANXIETY 12/28/2009  . OBESITY, UNSPECIFIED 01/25/2009  . DEPRESSION 01/25/2009  . ASTHMA 01/25/2009    Past Surgical History:  Procedure Laterality Date  . NO PAST SURGERIES      Prior to Admission medications   Medication Sig Start Date End Date Taking? Authorizing Provider  HYDROcodone-acetaminophen (NORCO) 5-325 MG tablet Take 1 tablet by mouth every 4 (four) hours as needed for moderate pain. 06/09/19   Willy Eddy, MD  omeprazole (PRILOSEC) 20 MG capsule Take 1 capsule (20  mg total) by mouth daily. 05/26/19   Tracey Harries, FNP  oxyCODONE-acetaminophen (PERCOCET) 5-325 MG tablet Take 2 tablets by mouth every 8 (eight) hours as needed for severe pain. 12/19/19   Loleta Rose, MD  sulfamethoxazole-trimethoprim (BACTRIM DS) 800-160 MG tablet Take 2 tablets by mouth 2 (two) times daily. 12/19/19   Loleta Rose, MD  Vitamin D, Ergocalciferol, (DRISDOL) 1.25 MG (50000 UT) CAPS capsule Take 1 capsule (50,000 Units total) by mouth every 7 (seven) days. 05/26/19   Tracey Harries, FNP    Allergies Patient has no known allergies.  Family History  Problem Relation Age of Onset  . Depression Mother   . Alcohol abuse Mother   . Drug abuse Mother   . Drug abuse Father   . Alcohol abuse Maternal Grandmother   . Drug abuse Maternal Grandmother   . Diabetes Maternal Grandmother   . Hypertension Maternal Grandmother   . Alcohol abuse Maternal Grandfather   . Drug abuse Maternal Grandfather   . Hypertension Maternal Grandfather   . Alcohol abuse Paternal Grandmother   . Drug abuse Paternal Grandmother   . Alcohol abuse Paternal Grandfather   . Drug abuse Paternal Grandfather     Social History Social History   Tobacco Use  . Smoking status: Former Games developer  . Smokeless tobacco: Never Used  Substance Use Topics  . Alcohol use: Yes    Comment: occasional- very rarely-   .  Drug use: Yes    Types: Marijuana    Comment: smokes THC- quit pregnancy     Review of Systems Constitutional: No fever/chills Respiratory: No difficulty breathing Gastrointestinal: No abdominal pain.  No nausea, no vomiting.   Genitourinary: Painful swollen area in her right groin.  Negative for dysuria. Musculoskeletal: Negative for neck pain.  Negative for back pain. Integumentary: Painful and swollen area in her right groin as described above. Neurological: Negative for headaches, focal weakness or numbness.   ____________________________________________   PHYSICAL EXAM:  VITAL  SIGNS: ED Triage Vitals  Enc Vitals Group     BP 12/19/19 0326 140/76     Pulse Rate 12/19/19 0326 98     Resp 12/19/19 0326 20     Temp 12/19/19 0326 98.6 F (37 C)     Temp Source 12/19/19 0326 Oral     SpO2 12/19/19 0326 99 %     Weight 12/19/19 0325 120.2 kg (265 lb)     Height 12/19/19 0325 1.626 m (5\' 4" )     Head Circumference --      Peak Flow --      Pain Score 12/19/19 0325 10     Pain Loc --      Pain Edu? --      Excl. in [place]? --     Constitutional: Alert and oriented.  Appears uncomfortable but not in severe distress. Eyes: Conjunctivae are normal.  Cardiovascular: Normal rate, regular rhythm. Good peripheral circulation.  Musculoskeletal: No lower extremity tenderness nor edema. No gross deformities of extremities. Neurologic:  Normal speech and language. No gross focal neurologic deficits are appreciated.  Skin:  Skin is warm, dry and intact.  She has an indurated area in her right proximal groin that is about 4 cm in diameter and consistent with a developing abscess.  However it is not fluctuant and there is no surrounding cellulitis.  Exquisitely tender to the touch.  No open or draining wound. Psychiatric: Mood and affect are normal. Speech and behavior are normal.  ____________________________________________   LABS (all labs ordered are listed, but only abnormal results are displayed)  Labs Reviewed - No data to display ____________________________________________  EKG  No indication for emergent EKG ____________________________________________  RADIOLOGY I, Hinda Kehr, personally viewed and evaluated these images (plain radiographs) as part of my medical decision making, as well as reviewing the written report by the radiologist.  ED MD interpretation: Developing phlegmon but with very dense material, possibly cystic or granulomatous  Official radiology report(s): [place] RT LOWER EXTREM LTD SOFT TISSUE NON VASCULAR  Result Date: 12/19/2019 CLINICAL  DATA:  RIGHT inguinal phlegmon versus abscess EXAM: ULTRASOUND RIGHT LOWER EXTREMITY LIMITED TECHNIQUE: Ultrasound examination of the lower extremity soft tissues was performed in the area of clinical concern. COMPARISON:  None. FINDINGS: Within the superficial subcutaneous tissue of the RIGHT groin there is a well-circumscribed mixed echogenicity hypoechoic cystic and solid ovoid lesion measuring 3.7 x 1.7 by 2.7 cm. No internal blood flow. There is mild peripheral blood flow. IMPRESSION: Findings most suggestive of subcutaneous infection with granulomatous process versus phlegmon or abscess. Consider aspiration although central material is fairly dense. Electronically Signed   By: Suzy Bouchard M.D.   On: 12/19/2019 06:51    ____________________________________________   PROCEDURES   Procedure(s) performed (including Critical Care):  Procedures   ____________________________________________   INITIAL IMPRESSION / MDM / [place] / ED COURSE  As part of my medical decision making, I reviewed the following data  within the electronic MEDICAL RECORD NUMBER Nursing notes reviewed and incorporated, Old chart reviewed, Notes from prior ED visits and [place] Controlled Substance Database  The patient most likely has a developing abscess in her right groin after shaving, although it is indurated and not fluctuant at this time.  Given that there may be no drainable fluid collection, I will obtain an ultrasound first before putting her through a painful and possibly unnecessary procedure.  She strongly agrees with this plan.  Anticipate antibiotic treatment regardless.  No sign of systemic infection and no surrounding cellulitis.  Clinical Course as of Dec 18 808  Sat Dec 19, 2019  3295 Ultrasound demonstrates granulomatous material versus phlegmon with what appears to be very little drainable fluid.  I discussed the results with the patient and offered incision and drainage versus needle  aspiration versus antibiotic treatment and she would prefer to try antibiotics first.  I think this is appropriate given the ultrasound results.  I gave strict return precautions if she is getting worse or developing new symptoms that she understands and agrees.  Prescriptions as listed below.  Follow-up information with general surgery was also provided.   [CF]    Clinical Course User Index [CF] Loleta Rose, MD     ____________________________________________  FINAL CLINICAL IMPRESSION(S) / ED DIAGNOSES  Final diagnoses:  Abscess     MEDICATIONS GIVEN DURING THIS VISIT:  Medications  oxyCODONE-acetaminophen (PERCOCET/ROXICET) 5-325 MG per tablet 2 tablet (2 tablets Oral Given 12/19/19 0557)  sulfamethoxazole-trimethoprim (BACTRIM DS) 800-160 MG per tablet 2 tablet (2 tablets Oral Given 12/19/19 0800)     ED Discharge Orders         Ordered    oxyCODONE-acetaminophen (PERCOCET) 5-325 MG tablet  Every 8 hours PRN     12/19/19 0743    sulfamethoxazole-trimethoprim (BACTRIM DS) 800-160 MG tablet  2 times daily     12/19/19 0743          *Please note:  Stacey Chen was evaluated in Emergency Department on 12/19/2019 for the symptoms described in the history of present illness. She was evaluated in the context of the global COVID-19 pandemic, which necessitated consideration that the patient might be at risk for infection with the SARS-CoV-2 virus that causes COVID-19. Institutional protocols and algorithms that pertain to the evaluation of patients at risk for COVID-19 are in a state of rapid change based on information released by regulatory bodies including the CDC and federal and state organizations. These policies and algorithms were followed during the patient's care in the ED.  Some ED evaluations and interventions may be delayed as a result of limited staffing during the pandemic.*  Note:  This document was prepared using Dragon voice recognition software and may include  unintentional dictation errors.   Loleta Rose, MD 12/19/19 [id number]

## 2021-04-30 IMAGING — CT CT RENAL STONE PROTOCOL
2 of 4 series · 17 of 46 positions shown, 19 images · non-contrast
Comparison: None.

CLINICAL DATA: Left flank pain

EXAM:
CT ABDOMEN AND PELVIS WITHOUT CONTRAST
TECHNIQUE: Multidetector CT imaging of the abdomen and pelvis was performed
following the standard protocol without IV contrast.

[Series 2: stone full standard · axial · 0.71mm/px · z∈[-412,-7]mm · 14 of 89 slices shown, 16 images]
[im 4/89  soft-tissue]
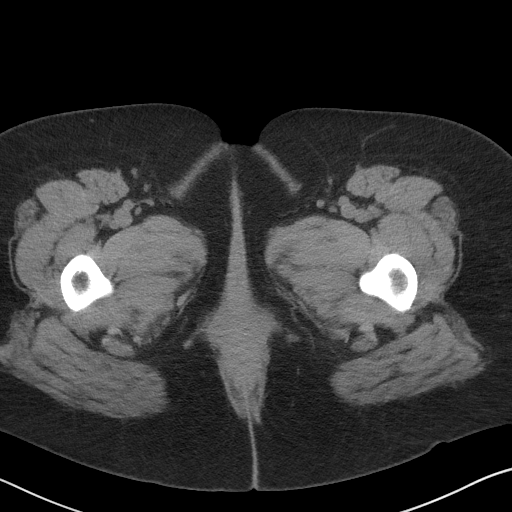
[im 4/89  bone]
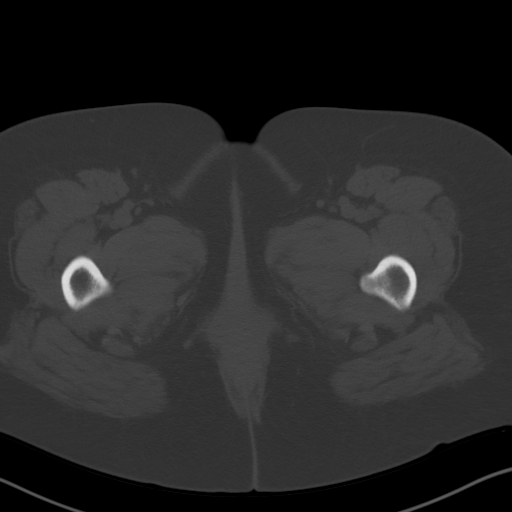
[im 12/89  soft-tissue]
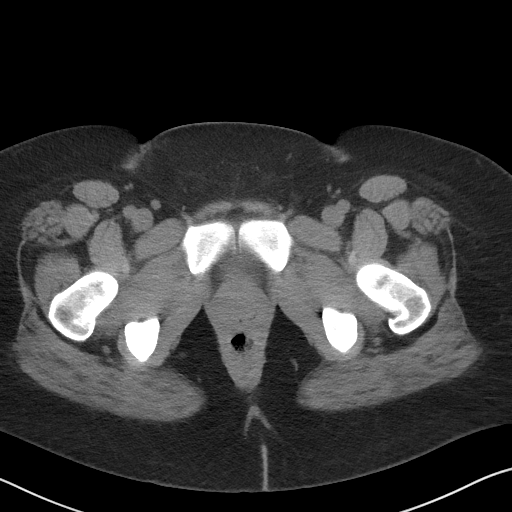
[im 19/89  soft-tissue]
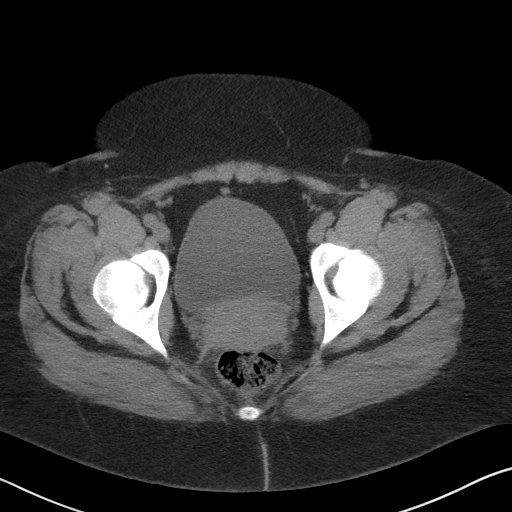
[im 23/89  soft-tissue]
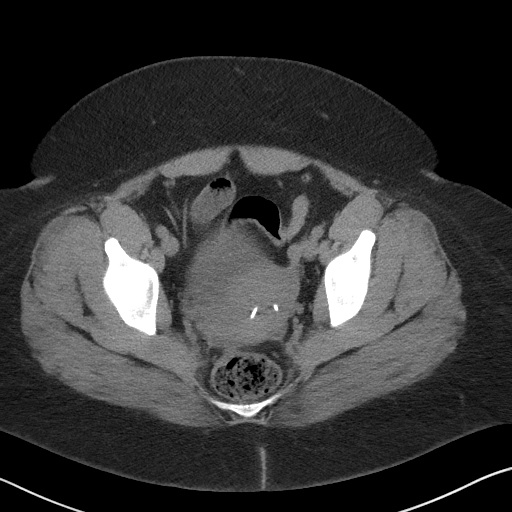
[im 30/89  soft-tissue]
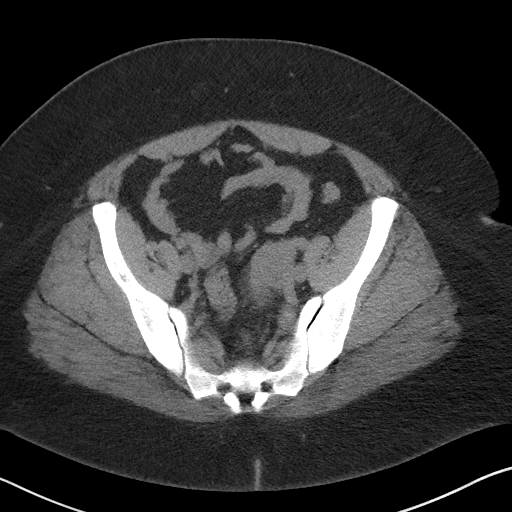
[im 37/89  soft-tissue]
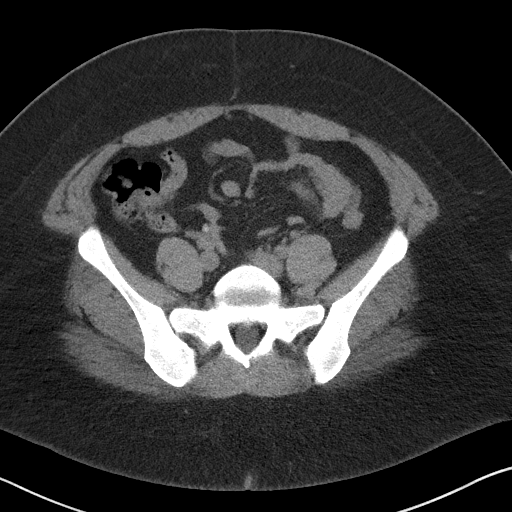
[im 41/89  soft-tissue]
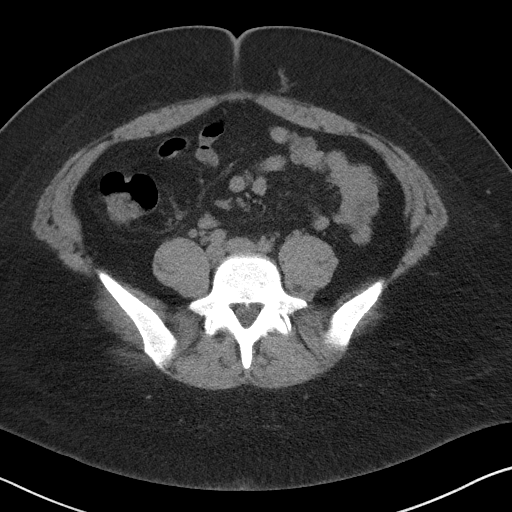
[im 48/89  soft-tissue]
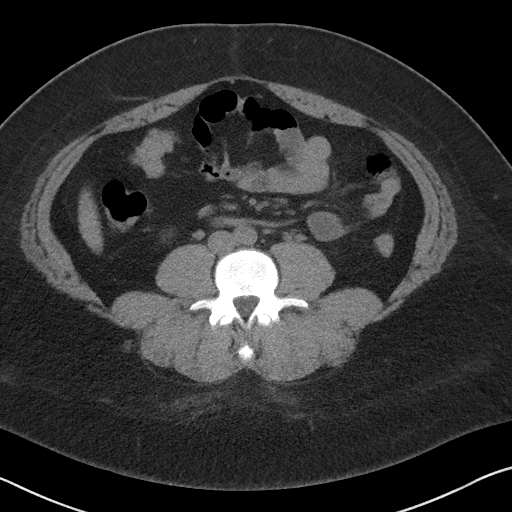
[im 52/89  soft-tissue]
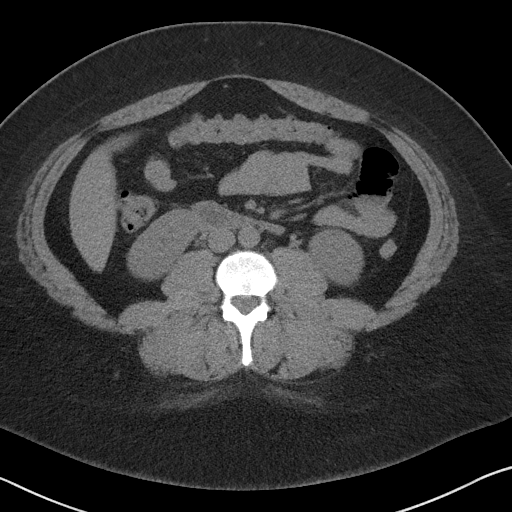
[im 52/89  bone]
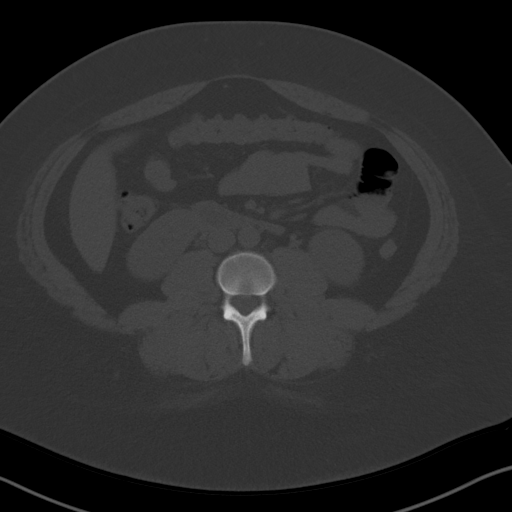
[im 59/89  soft-tissue]
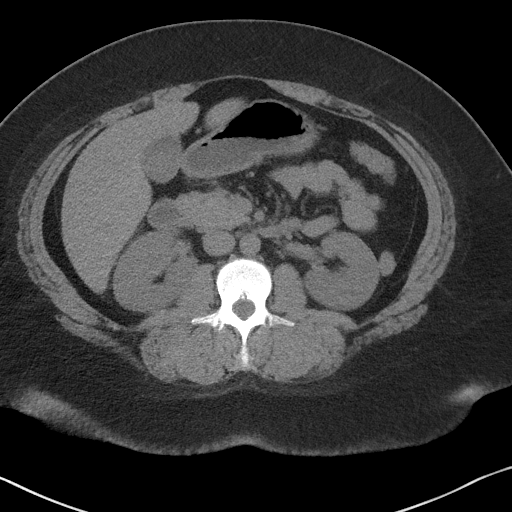
[im 67/89  soft-tissue]
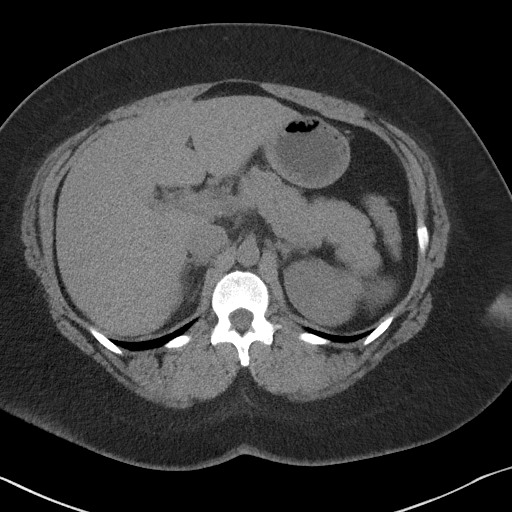
[im 70/89  soft-tissue]
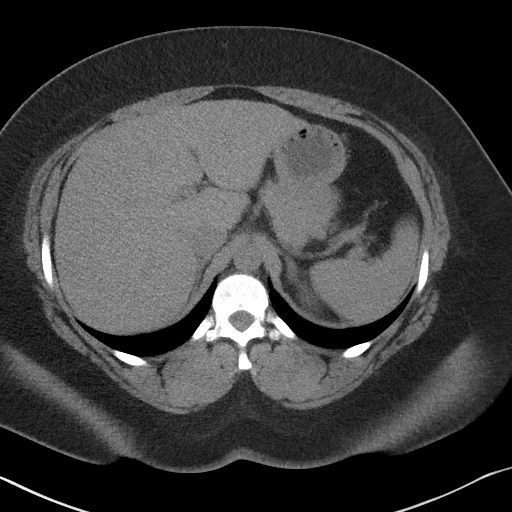
[im 78/89  soft-tissue]
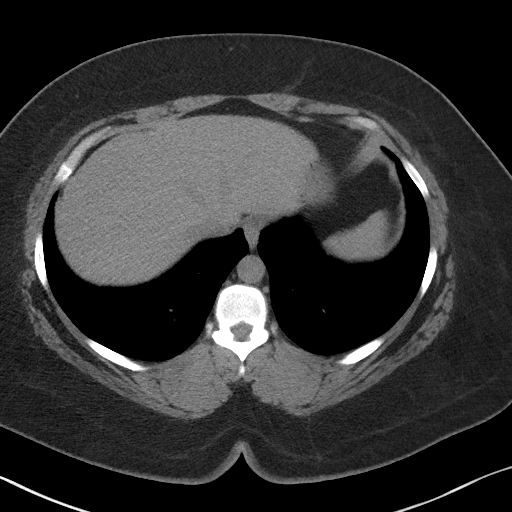
[im 85/89  soft-tissue]
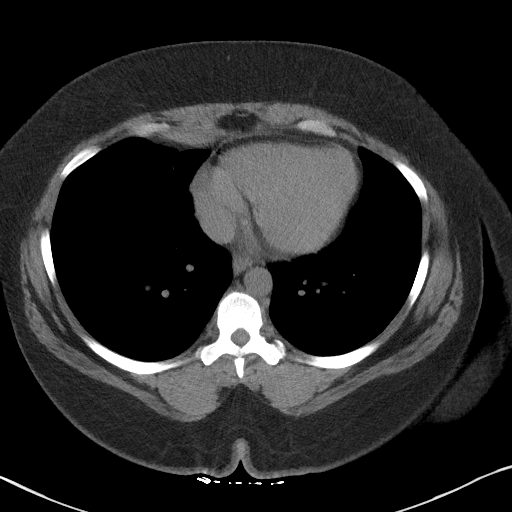

[Series 5: coronal · coronal · 0.75mm/px · 3 of 164 slices shown]
[im 55/164  soft-tissue]
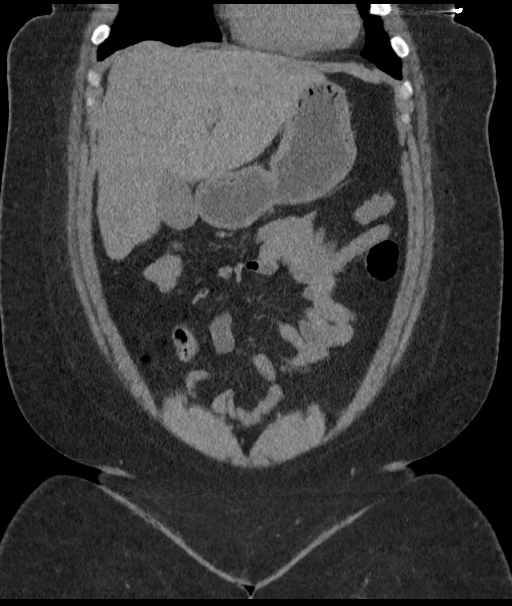
[im 73/164  soft-tissue]
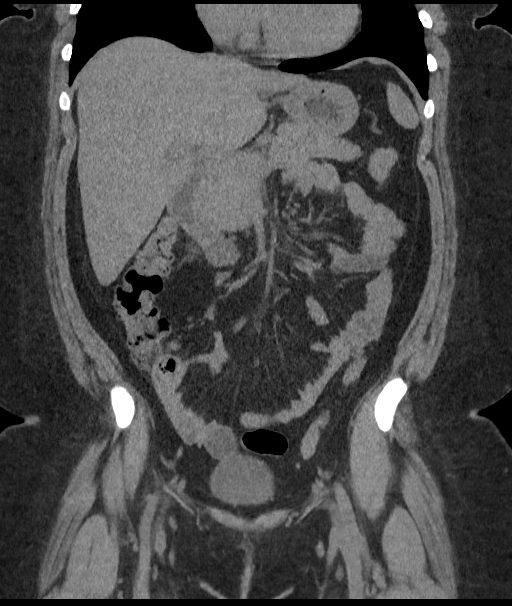
[im 91/164  soft-tissue]
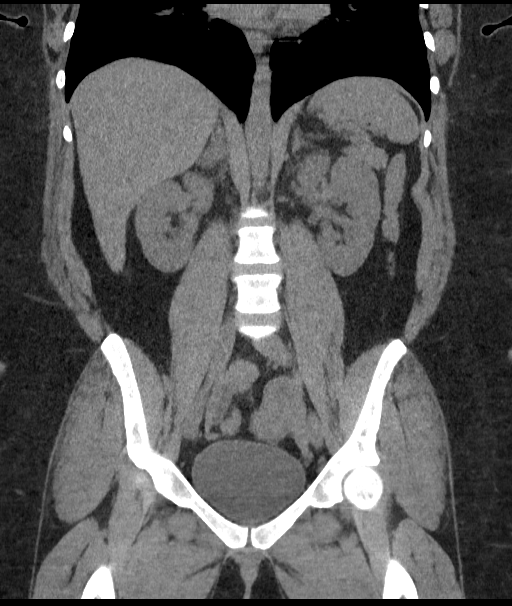

[17 of 46 positions shown; findings below may reference images not displayed]

FINDINGS: Lower chest: Lung bases are clear. No effusions. Heart is normal
size.

Hepatobiliary: No focal hepatic abnormality. Gallbladder
unremarkable.

Pancreas: No focal abnormality or ductal dilatation.

Spleen: No focal abnormality.  Normal size.

Adrenals/Urinary Tract: No adrenal abnormality. No focal renal
abnormality. No stones or hydronephrosis. Urinary bladder is
unremarkable.

Stomach/Bowel: Normal appendix. Stomach, large and small bowel
grossly unremarkable.

Vascular/Lymphatic: No evidence of aneurysm or adenopathy.

Reproductive: IUD in the uterus. Uterus and adnexa unremarkable. No
mass.

Other: No free fluid or free air.

Musculoskeletal: No acute bony abnormality.
IMPRESSION: No renal or ureteral stones.  No hydronephrosis.

No acute findings in the abdomen or pelvis.

## 2021-11-09 IMAGING — US US EXTREM LOW*R* LIMITED
1 series · 7 of 7 positions shown · non-contrast
Comparison: None.

CLINICAL DATA: RIGHT inguinal phlegmon versus abscess

EXAM:
ULTRASOUND RIGHT LOWER EXTREMITY LIMITED
TECHNIQUE: Ultrasound examination of the lower extremity soft tissues was
performed in the area of clinical concern.

[Series 1: us soft tissue lower extremity limited right (non- · 7 of 7 slices shown]
[im 1/7]
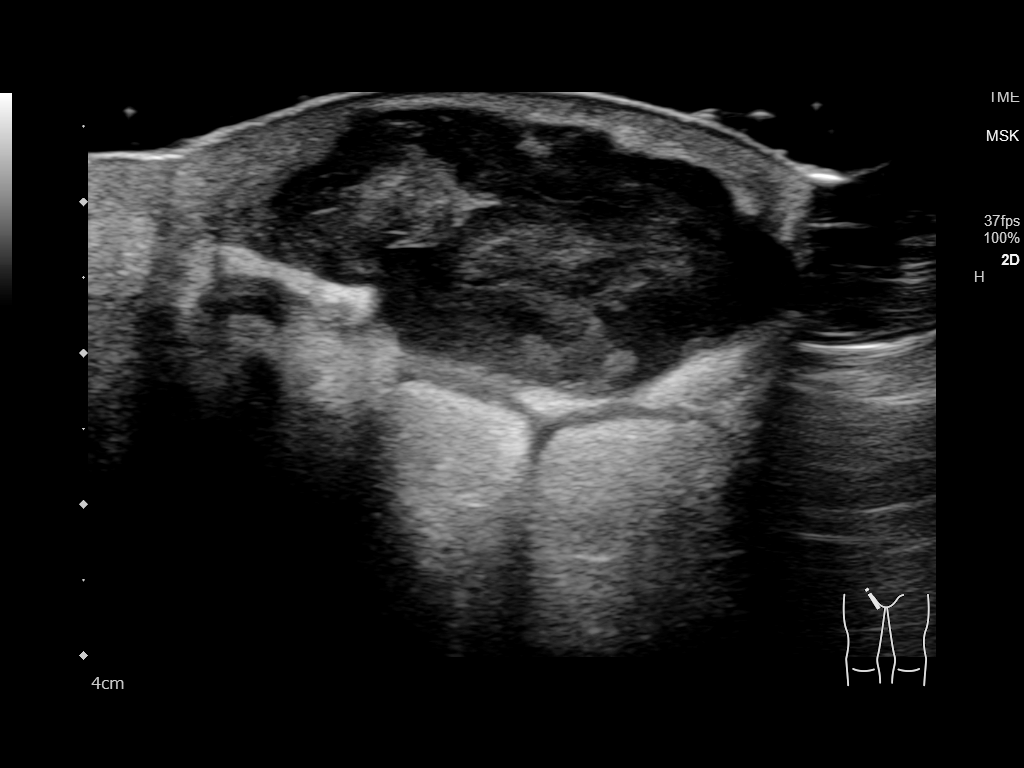
[im 2/7]
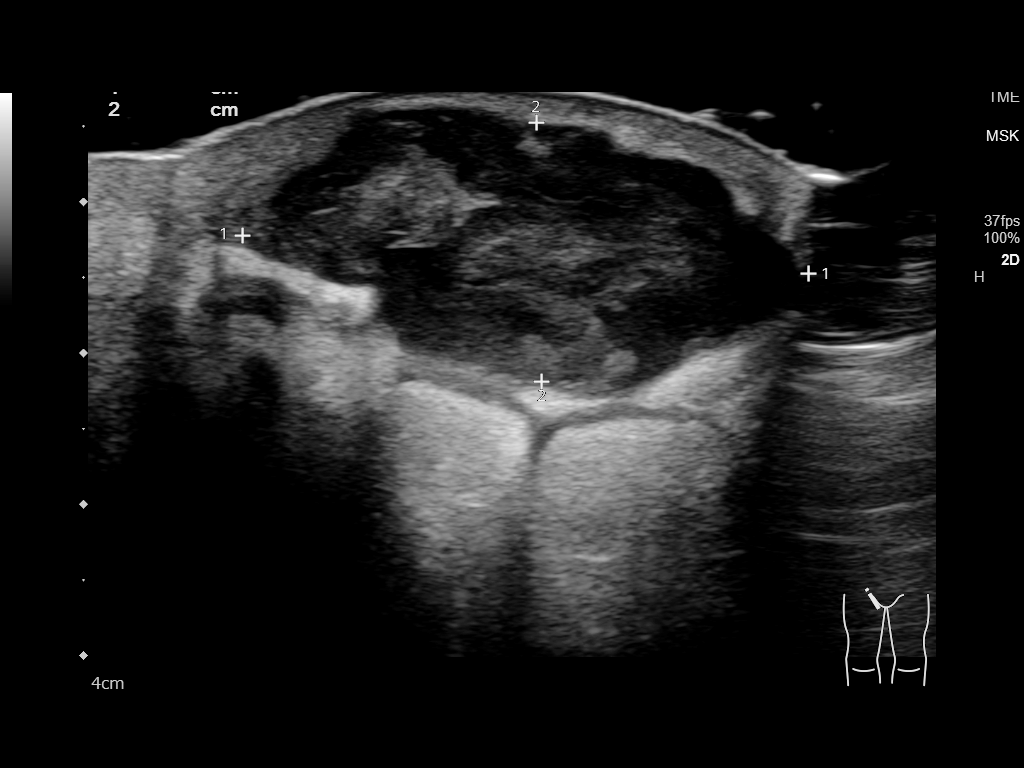
[im 3/7]
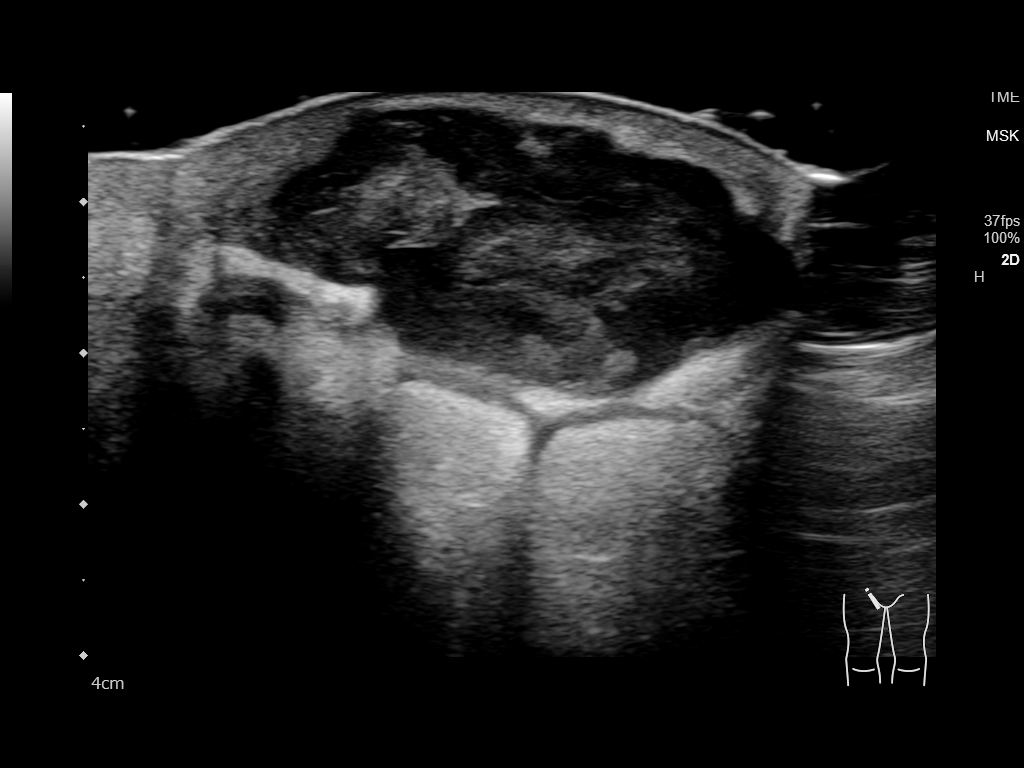
[im 4/7]
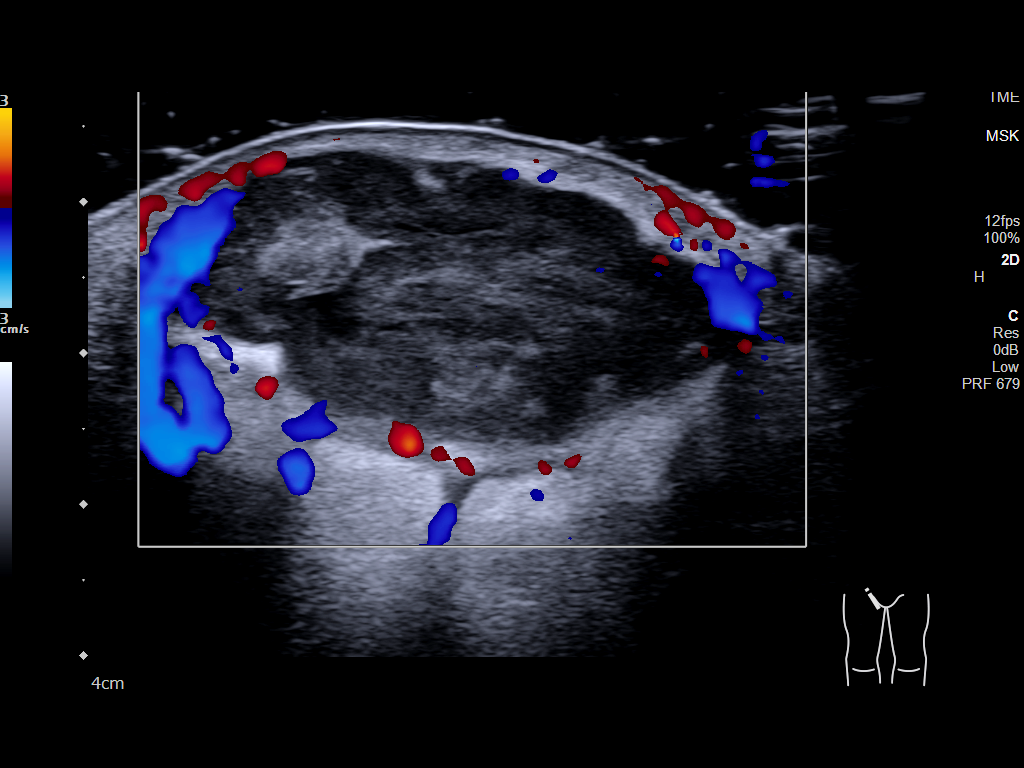
[im 5/7]
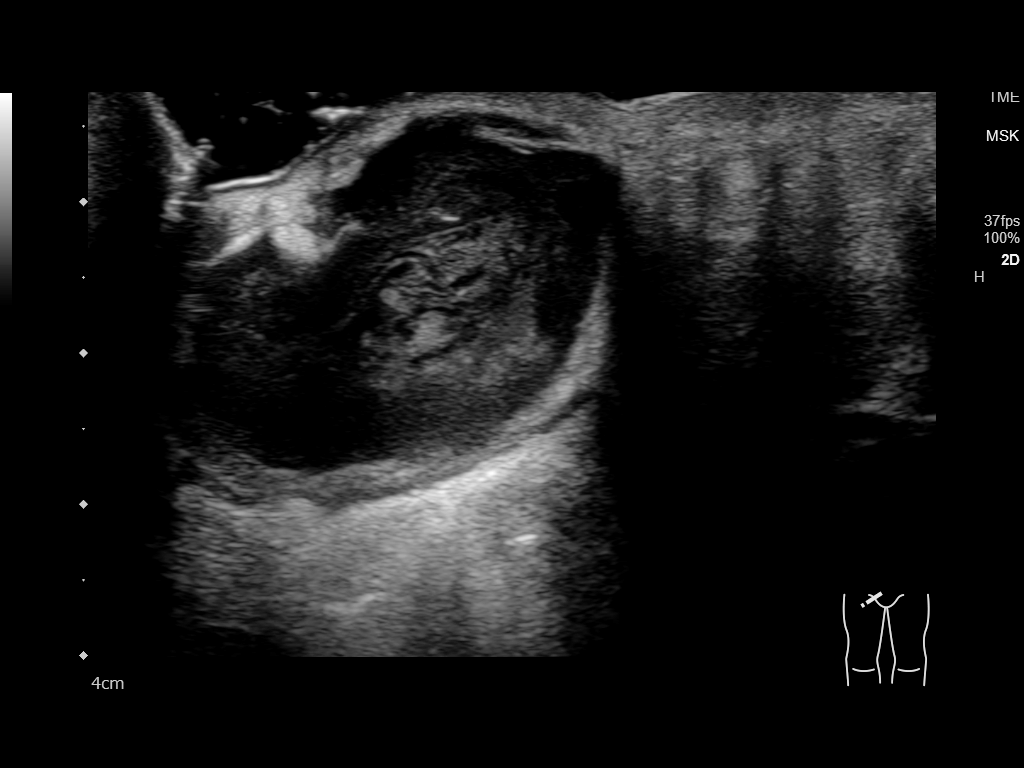
[im 6/7]
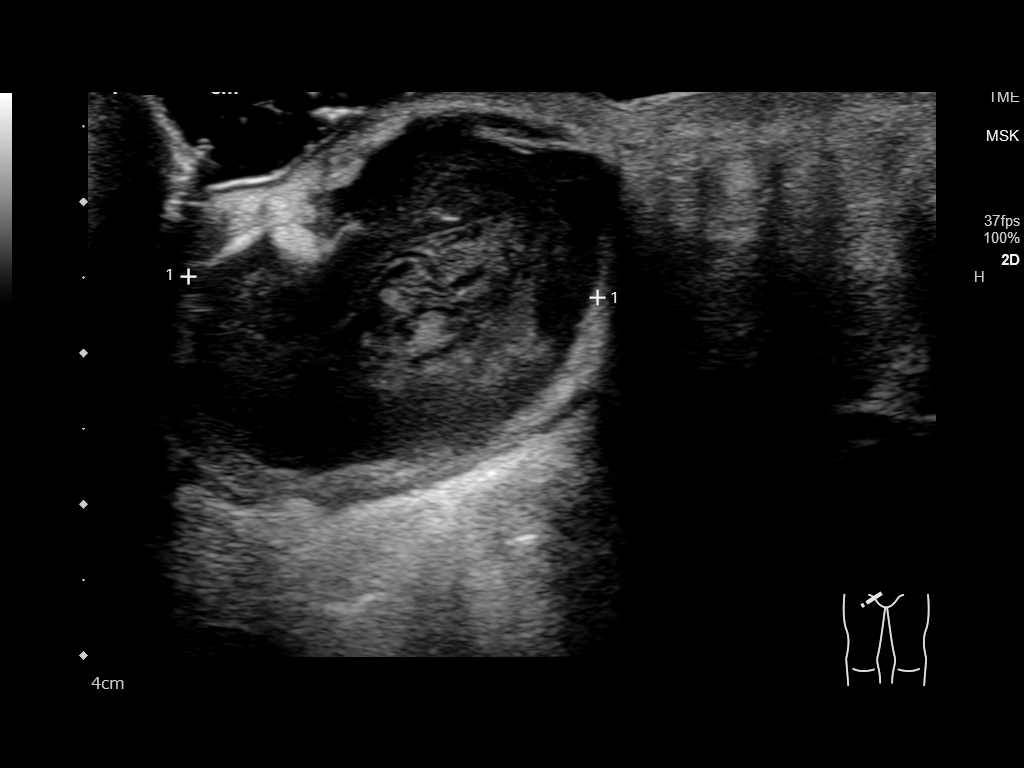
[im 7/7]
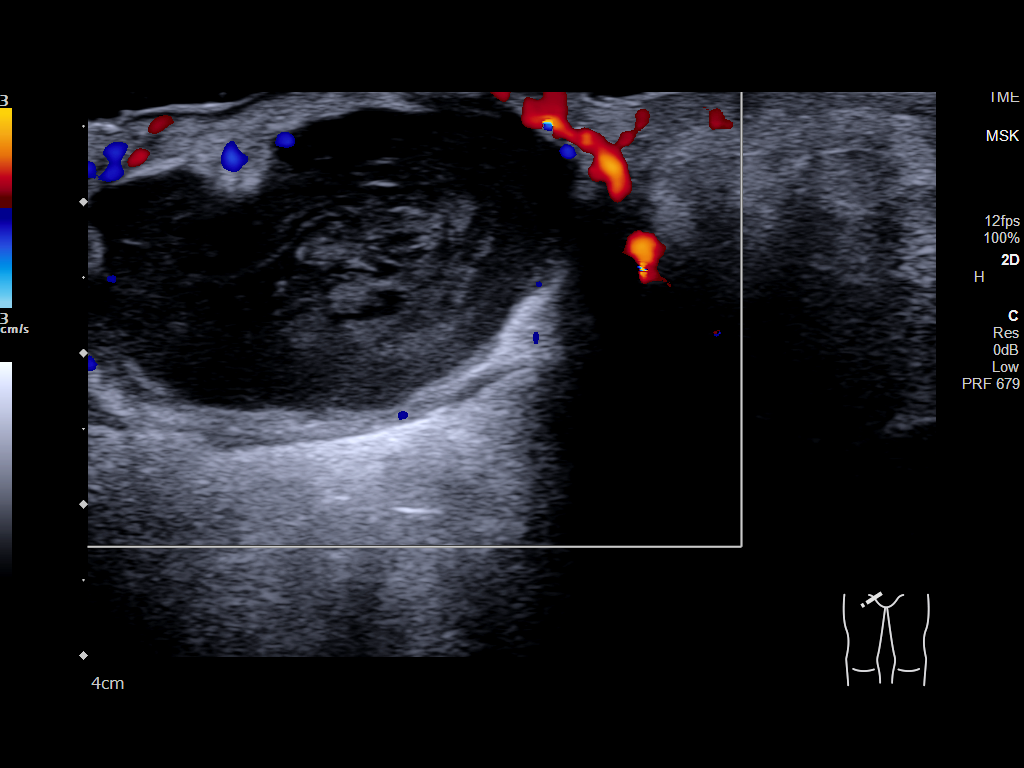

[7 of 7 positions shown; findings below may reference images not displayed]

FINDINGS: Within the superficial subcutaneous tissue of the RIGHT groin there
is a well-circumscribed mixed echogenicity hypoechoic cystic and
solid ovoid lesion measuring 3.7 x 1.7 by 2.7 cm. No internal blood
flow. There is mild peripheral blood flow.
IMPRESSION: Findings most suggestive of subcutaneous infection with
granulomatous process versus phlegmon or abscess. Consider
aspiration although central material is fairly dense.
